# Patient Record
Sex: Male | Born: 1955 | Race: White | Hispanic: No | Marital: Married
Health system: Southern US, Academic
[De-identification: ages and names within clinical notes are randomized; demographics above are authoritative.]

## PROBLEM LIST (undated history)

## (undated) ENCOUNTER — Encounter

## (undated) ENCOUNTER — Ambulatory Visit: Attending: Family | Primary: Family

## (undated) ENCOUNTER — Telehealth

## (undated) ENCOUNTER — Ambulatory Visit: Payer: MEDICARE

## (undated) ENCOUNTER — Ambulatory Visit

## (undated) ENCOUNTER — Encounter: Attending: Family | Primary: Family

## (undated) ENCOUNTER — Ambulatory Visit: Payer: MEDICARE | Attending: Family | Primary: Family

## (undated) ENCOUNTER — Ambulatory Visit: Payer: Medicare (Managed Care)

## (undated) ENCOUNTER — Telehealth
Attending: Student in an Organized Health Care Education/Training Program | Primary: Student in an Organized Health Care Education/Training Program

## (undated) ENCOUNTER — Inpatient Hospital Stay

## (undated) ENCOUNTER — Telehealth
Attending: Rehabilitative and Restorative Service Providers" | Primary: Rehabilitative and Restorative Service Providers"

## (undated) ENCOUNTER — Ambulatory Visit: Payer: MEDICARE | Attending: Rheumatology | Primary: Rheumatology

## (undated) ENCOUNTER — Encounter
Attending: Rehabilitative and Restorative Service Providers" | Primary: Rehabilitative and Restorative Service Providers"

## (undated) DIAGNOSIS — T4145XA Adverse effect of unspecified anesthetic, initial encounter: Secondary | ICD-10-CM

## (undated) DIAGNOSIS — F419 Anxiety disorder, unspecified: Secondary | ICD-10-CM

## (undated) DIAGNOSIS — M1990 Unspecified osteoarthritis, unspecified site: Secondary | ICD-10-CM

## (undated) DIAGNOSIS — I10 Essential (primary) hypertension: Secondary | ICD-10-CM

## (undated) DIAGNOSIS — F32A Depression, unspecified: Secondary | ICD-10-CM

## (undated) DIAGNOSIS — M7061 Trochanteric bursitis, right hip: Secondary | ICD-10-CM

## (undated) DIAGNOSIS — E785 Hyperlipidemia, unspecified: Secondary | ICD-10-CM

## (undated) DIAGNOSIS — F329 Major depressive disorder, single episode, unspecified: Secondary | ICD-10-CM

## (undated) HISTORY — PX: TONSILLECTOMY: SUR1361

## (undated) MED ORDER — METFORMIN ER 500 MG TABLET,EXTENDED RELEASE 24 HR: 0 days

## (undated) MED ORDER — NITROGLYCERIN 0.4 MG SUBLINGUAL TABLET: 0 days

## (undated) MED ORDER — TIZANIDINE 4 MG TABLET: 0 days

## (undated) MED ORDER — LOSARTAN 25 MG TABLET: 0 days

## (undated) MED ORDER — DULOXETINE 30 MG CAPSULE,DELAYED RELEASE: 0 days

## (undated) MED ORDER — LISINOPRIL 20 MG-HYDROCHLOROTHIAZIDE 12.5 MG TABLET: 0 days

## (undated) MED ORDER — ASPIRIN 81 MG CHEWABLE TABLET: 0 days

## (undated) MED ORDER — EMPAGLIFLOZIN 25 MG TABLET: 0 days

## (undated) MED ORDER — AMLODIPINE 5 MG TABLET: 0 days

## (undated) MED ORDER — PRASUGREL 10 MG TABLET: 0 days

## (undated) MED ORDER — LISINOPRIL 40 MG TABLET: 0 days

## (undated) MED ORDER — TRADJENTA 5 MG TABLET: 0 days

## (undated) MED ORDER — AMLODIPINE 10 MG TABLET: 0 days

## (undated) MED ORDER — TRAMADOL 50 MG TABLET: 0 days

## (undated) MED ORDER — PRAVASTATIN 40 MG TABLET: 0 days

## (undated) MED ORDER — INSULIN GLARGINE (U-100) 100 UNIT/ML (3 ML) SUBCUTANEOUS PEN: 0 days

---

## 1999-08-17 ENCOUNTER — Ambulatory Visit (HOSPITAL_BASED_OUTPATIENT_CLINIC_OR_DEPARTMENT_OTHER): Admission: RE | Admit: 1999-08-17 | Discharge: 1999-08-17 | Payer: Self-pay | Admitting: Orthopedic Surgery

## 2002-04-09 HISTORY — PX: CARDIAC CATHETERIZATION: SHX172

## 2002-12-21 ENCOUNTER — Encounter: Payer: Self-pay | Admitting: Emergency Medicine

## 2002-12-21 ENCOUNTER — Inpatient Hospital Stay (HOSPITAL_COMMUNITY): Admission: EM | Admit: 2002-12-21 | Discharge: 2002-12-23 | Payer: Self-pay | Admitting: Emergency Medicine

## 2004-04-01 ENCOUNTER — Emergency Department: Payer: Self-pay | Admitting: Unknown Physician Specialty

## 2004-04-09 HISTORY — PX: SHOULDER ARTHROSCOPY: SHX128

## 2004-04-09 HISTORY — PX: KNEE ARTHROSCOPY: SUR90

## 2004-06-22 ENCOUNTER — Ambulatory Visit (HOSPITAL_COMMUNITY): Admission: RE | Admit: 2004-06-22 | Discharge: 2004-06-22 | Payer: Self-pay | Admitting: Orthopedic Surgery

## 2004-06-22 ENCOUNTER — Ambulatory Visit (HOSPITAL_BASED_OUTPATIENT_CLINIC_OR_DEPARTMENT_OTHER): Admission: RE | Admit: 2004-06-22 | Discharge: 2004-06-22 | Payer: Self-pay | Admitting: Orthopedic Surgery

## 2005-01-01 ENCOUNTER — Encounter: Admission: RE | Admit: 2005-01-01 | Discharge: 2005-01-01 | Payer: Self-pay | Admitting: Orthopedic Surgery

## 2005-02-14 ENCOUNTER — Ambulatory Visit (HOSPITAL_BASED_OUTPATIENT_CLINIC_OR_DEPARTMENT_OTHER): Admission: RE | Admit: 2005-02-14 | Discharge: 2005-02-14 | Payer: Self-pay | Admitting: Orthopedic Surgery

## 2005-02-14 ENCOUNTER — Ambulatory Visit (HOSPITAL_COMMUNITY): Admission: RE | Admit: 2005-02-14 | Discharge: 2005-02-14 | Payer: Self-pay | Admitting: Orthopedic Surgery

## 2007-04-10 DIAGNOSIS — T8859XA Other complications of anesthesia, initial encounter: Secondary | ICD-10-CM

## 2007-04-10 HISTORY — PX: SHOULDER ARTHROSCOPY: SHX128

## 2007-04-10 HISTORY — DX: Other complications of anesthesia, initial encounter: T88.59XA

## 2007-09-25 ENCOUNTER — Ambulatory Visit (HOSPITAL_BASED_OUTPATIENT_CLINIC_OR_DEPARTMENT_OTHER): Admission: RE | Admit: 2007-09-25 | Discharge: 2007-09-25 | Payer: Self-pay | Admitting: Orthopedic Surgery

## 2007-09-29 ENCOUNTER — Emergency Department: Payer: Self-pay | Admitting: Emergency Medicine

## 2008-12-23 ENCOUNTER — Ambulatory Visit: Payer: Self-pay | Admitting: Gastroenterology

## 2010-08-22 NOTE — Op Note (Signed)
NAME:  Lance [name], Lance [name]                 ACCOUNT NO.:  [id number]   MEDICAL RECORD NO.:  [id number]          PATIENT TYPE:  AMB   LOCATION:  DSC                          FACILITY:  [facility]   PHYSICIAN:  Loreta Ave, M.D. DATE OF BIRTH:  10/05/1955   DATE OF PROCEDURE:  DATE OF DISCHARGE:                               OPERATIVE REPORT   PREOPERATIVE DIAGNOSES:  Left shoulder impingement, distal clavicle  osteolysis.   POSTOPERATIVE DIAGNOSES:  Left shoulder impingement, distal clavicle  osteolysis, secondary adhesive capsulitis moderate.   PROCEDURES:  Left shoulder exam manipulation under anesthesia.  Arthroscopy with lysis debridement of adhesions and synovitis.  Bursectomy, acromioplasty CA ligament release.  Excision distal  clavicle.   SURGEON:  Loreta Ave, M.D.   ASSISTANT:  Zonia Kief, PA   ANESTHESIA:  General   BLOOD LOSS:  Minimal.   SPECIMEN:  None.   COUNTS:  None.   COMPLICATIONS:  None.   DRESSING:  Soft compressive with sling.   PROCEDURE:  The patient was brought to the operating room, placed on  operating table in supine position.  After adequate anesthesia had been  obtained the shoulder examined.  Lacked about 30% of motion, especially  overhead and external rotation.  Shoulder was manipulated with obvious  breaking of adhesions.  Achieve full motion stable shoulder.  Placed in  beachchair position on a shoulder positioner, prepped and draped in  usual sterile fashion.  Three portals created, once each anterior,  posterior, and lateral.  Shoulder entered with blunt obturator,  distended, inspected.  Remaining adhesions, synovitis all debrided.  Biceps tendon, biceps anchor, undersurface cuff, capsule ligamentous  structures, articular cartilage all looked good, otherwise.  Cannula  redirected subacromially.  A lot of reactive bursitis.  Type 2 to 3  acromion.  Bursa resected and cuff debrided.  No full-thickness tears.  Acromioplasty to a type 1  acromion with shaver and high-speed bur.  CA  ligament released with cautery.  Distal clavicle with grade 3 changes.  Periarticular spurs and lateral a centimeter of clavicle were resected  with shaver and high-speed bur.  A completion adequacy of decompression,  clavicle excision, acromioplasty confirmed viewing from all portals.  Instruments and fluid removed.  Portals shoulder bursa injected with  Marcaine.  Portals closed with nylon.  Sterile compressive dressing  applied.  Anesthesia reversed.  Brought to recovery room.  He tolerated  surgery well with no complications.      Loreta Ave, M.D.  Electronically Signed     DFM/MEDQ  D:  09/25/2007  T:  09/26/2007  Job:  [id number]

## 2010-08-25 NOTE — Op Note (Signed)
NAME:  Lance [name], Lance [name]                 ACCOUNT NO.:  [id number]   MEDICAL RECORD NO.:  [id number]          PATIENT TYPE:  AMB   LOCATION:  DSC                          FACILITY:  [facility]   PHYSICIAN:  Loreta Ave, M.D. DATE OF BIRTH:  10-Jan-1956   DATE OF PROCEDURE:  02/14/2005  DATE OF DISCHARGE:                                 OPERATIVE REPORT   PREOPERATIVE DIAGNOSIS:  Post traumatic impingement, partial tearing,  rotator cuff, right shoulder, degenerative joint disease acromioclavicular  joint, anterior labral tear.   POSTOPERATIVE DIAGNOSIS:  Post traumatic impingement, partial tearing,  rotator cuff, right shoulder, degenerative joint disease acromioclavicular  joint, anterior labral tear, without full thickness tears of the cuff,  partial tearing superior margin of the subscapular tendon and, also, some  partial tearing supraspinatus tendon above and below the anterior aspect.   OPERATIVE PROCEDURE:  Right shoulder exam under anesthesia, arthroscopy,  debridement of labrum, subscapular tendon, and supraspinatus tendon,  bursectomy, acromioplasty, coracoacromial ligament release, excision distal  clavicle.   SURGEON:  Loreta Ave, M.D.   ASSISTANT:  Genene Churn. Barry Dienes, P.A.-C.   ANESTHESIA:  General.   ESTIMATED BLOOD LOSS:  Minimal.   SPECIMENS:  None.   CULTURES:  None.   COMPLICATIONS:  None.   DRESSINGS:  Soft compressive with a sling.   PROCEDURE:  The patient was brought to the operating room and placed on the  operating table in supine position.  After adequate anesthesia had been  obtained, the right shoulder was examined.  Full motion and good stability.  Placed in a beach chair position on the shoulder positioner, prepped and  draped in the usual sterile fashion.  Anterior, posterior, and lateral  portals.  The shoulder entered with blunt obturator posteriorly.  Arthroscope introduced, shoulder distended.  Shoulder inspected.  A complex  tear anterior  labrum, debrided.  No significant perilabral cyst.  Partial  tearing top of the subscapular tendon also debrided but still very  reasonable integrity and no tearing off the lateral attachment.  Biceps  tendon, biceps anchor intact.  A little roughening under surface of the  rotator cuff anteriorly debrided.  No full thickness tears.  Capsular  ligamentous structures and remaining articular cartilage intact.  The frayed  tearing of the frontal ligament debrided.  Cannula redirected subacromially.  Impingement with some roughening on the top of the cuff debrided.  Bursa  resected.  Type 2-3 acromion.  Treated with acromioplasty with shaver and  high speed bur to a type 1 acromion.  CA ligament released with cautery.  Grade 3 and 4 chondromalacia with osteolysis, AC joint.  Periarticular spurs  with the lateral cm of clavicle resected.  Adequacy of decompression,  clavicle excision, and cuff debridement was confirmed viewing from all  portals.  Instruments and fluid removed.  Portals and bursa injected with  Marcaine.  Portals closed with 4-0 nylon.  Sterile compressive dressing  applied.  Sling applied.  Anesthesia reversed.  Brought to the recovery  room.  Tolerated the surgery well without complications.      Loreta Ave, M.D.  Electronically Signed     DFM/MEDQ  D:  02/14/2005  T:  02/14/2005  Job:  [id number]

## 2010-08-25 NOTE — Cardiovascular Report (Signed)
NAME:  DANNIEL, TONES NO.:  [id number]   MEDICAL RECORD NO.:  [id number]                   PATIENT TYPE:  INP   LOCATION:  3705                                 FACILITY:  [facility]   PHYSICIAN:  Vida Roller, M.D.                DATE OF BIRTH:  [birth year]   DATE OF PROCEDURE:  12/22/2002  DATE OF DISCHARGE:                              CARDIAC CATHETERIZATION   PRIMARY CARE PHYSICIAN:  Karrie Doffing, M.D., at the [facility].   CARDIOLOGIST:  Carole Binning, M.D.   HISTORY OF PRESENT ILLNESS:  Mr. Shi is a 55 year old, morbidly obese man  with diabetes mellitus, hypertension, and hyperlipidemia, who has  nonobstructive coronary artery disease on a catheterization done several  years ago at [place], [place], who presents with chest discomfort  concerning for coronary artery disease.  He was taken to the cardiac  catheterization laboratory.   DETAILS OF THE PROCEDURE:  After obtaining informed consent, the patient was  brought to the cardiac catheterization laboratory in the fasting state.  There he was prepped and draped in the usual sterile manner and local  anesthetic was obtained over the right groin using 1% lidocaine without  epinephrine.  The right femoral artery was cannulated using the modified  Seldinger technique with a 6 French 10 cm sheath.  Left heart  catheterization was performed using a 6 French Judkins left #4, a 6 French  right #4, and a 6 French angled pigtail catheter.  The pigtail catheter was  used for a left ventriculogram which was imaged in the 30 degree RAO view.  At the conclusion the procedure, the catheters were removed.  The patient  was moved back to the cardiology holding area where the femoral artery  sheath was removed.  Hemostasis was obtained using direct manual pressure.  At the conclusion of the hold, there was no evidence of ecchymosis or  hematoma formation.  Distal pulses were intact.   The total fluoroscopic time  was 2.8 minutes.  Total ionized contrast 140 mL.   RESULTS:  The aortic pressure was 144/94 with a mean arterial pressure of  117.   The left ventricular pressure was 146/9 with an end-diastolic pressure of 20  mmHg.   CORONARY ANGIOGRAPHY:  The left main coronary artery was a large vessel  which was angiographically normal.   The left anterior descending coronary artery was a large transapical vessel  with a 25% stenosis in its mid portion.  There was a large diagonal which  bifurcates along the anterolateral wall.  It is angiographically free of  disease.   The left circumflex coronary artery is a large caliber vessel which has  several small obtuse marginals in a posterolateral branch which branches.  There is no significant angiographic disease.   The right coronary is a large dominant vessel with two acute marginals, two  posterolateral branches, and  a moderate caliber posterior descending  coronary artery.  The right coronary artery is free of disease.   The left ventriculogram reveals an ejection fraction of 60% with no wall  motion abnormalities and no mitral regurgitation.   ASSESSMENT:  1. Nonobstructive coronary disease.  2. Normal left ventricular function.  3. Noncardiac chest pain.   PLAN:  Our plan is to discharge this patient to home in the morning to  modify aggressively his risk factors.  Karrie Doffing, M.D., and Carole Binning, M.D., are both aware of the results of this catheterization.                                               Vida Roller, M.D.    JH/MEDQ  D:  12/22/2002  T:  12/22/2002  Job:  [id number]

## 2010-08-25 NOTE — Discharge Summary (Signed)
NAME:  Lance [name], Lance [name] NO.:  [id number]   MEDICAL RECORD NO.:  [id number]                   PATIENT TYPE:  INP   LOCATION:  3705                                 FACILITY:  [facility]   PHYSICIAN:  Carole Binning, M.D. [facility]         DATE OF BIRTH:  06-24-1955   DATE OF ADMISSION:  12/21/2002  DATE OF DISCHARGE:  12/23/2002                           DISCHARGE SUMMARY - REFERRING   PROCEDURES:  1. Cardiac catheterization.  2. Coronary arteriogram.  3. Left ventriculogram.   HISTORY:  Lance [name] is a 55 year old male with no known history of coronary  artery disease.  He is followed by his family physician in [facility]  and was admitted four years ago to [facility] in  [place] with chest pain.  At that time, he had an abnormal stress  Cardiolite and cardiac catheterization, but there was no intervention done,  and the patient states the catheterization was clean.  Three days prior to  admission, he developed chest tightness and shortness of breath as well as  palpitations.  These resolved in seconds, but they were recurrent.  He came  to the emergency room and was admitted for further evaluation and treatment.   HOSPITAL COURSE:  His enzymes were negative for MI, but because of his risk  factors including diabetes, hypertension, and hyperlipidemia, he was  scheduled for cardiac catheterization which was performed on 12/22/2002.  The  cardiac catheterization showed no coronary artery disease except for 25%  lesion in the LAD.  His EF was 60% with no wall motion abnormalities and no  MR.  Dr. Dorethea Clan reviewed the films and felt that risk factor modification  was indicated.   As part of his evaluation, Lance [name] had a lipid profile performed which  showed a total cholesterol of 185, triglycerides 166, HDL 38, LDL 114.  He  had been on Lipitor 10 mg prior to admission, and this was increased to 20  mg.   Lance [name]'s blood  pressure dropped post catheterization, and his systolic  blood pressure was in the 90s at times.  HCTZ was held post catheterization,  and by discharge, his systolic blood pressure was in the 120s.  It was felt  this was secondary to the procedure, and he is to resume his normal blood  pressure control medications as an outpatient.   By 12/23/2002, Lance [name] was having no more episodes of chest pain.  He was  ambulating with no shortness of breath.  He was seen by the diabetes  coordinator, and arrangements were made to give him prescription for a new  glucose meter so that he would be more compliant with his diabetic diet and  follow his blood sugars more closely.  He was also advised that he needed to  stick more tightly to a diabetic diet and follow up with Dr. Illene [name].  Mr.  [name] was considered  stable for discharge on 12/23/2002.   LABORATORY DATA:  Sodium 139, potassium 3.9, chloride 103, CO2 28, BUN 10,  creatinine 1.1, glucose 154.  Hemoglobin 15.9, hematocrit 47.0, WBC 8.4,  platelets 232.   Chest x-ray: No acute cardiopulmonary findings.   CONDITION ON DISCHARGE:  Stable.   DISCHARGE DIAGNOSES:  1. Chest pain, no critical coronary artery disease by catheterization this     admission. Symptoms have resolved.  2. Preserved left ventricular function by catheterization this admission.  3. Hyperlipidemia.  4. Hypertension.  5. Hypotension this admission, resolved.  6. Diabetes.  7. Obesity.  8. History of multiple surgeries on both of his knees.  9. Anxiety/depression.   DISCHARGE INSTRUCTIONS:  1. His activity level is to include no driving, sexual or strenuous activity     for two days.  2. He is to call the office for problems with the catheterization site.  3. He is to stick to a low-fat diabetic diet.  4. He is to see Dr. Rinaldo Cloud and call for an appointment.  5. He is to follow up with Dr. Gerri Spore on October 4, at 2:15.   DISCHARGE MEDICATIONS:  1. Zoloft 50 mg  p.o. daily.  2. Aspirin 81 mg p.o. daily.  3. Glucotrol 10 mg p.o. daily.  4. Lipitor 20 mg p.o. daily.  5. HCTZ 25 mg p.o. daily, restart in a.m.  6. Metformin 1000 mg b.i.d., restart Friday, September 17.  7. Accupril 40 mg p.o. b.i.d.  8. Protonix 40 mg p.o. daily.      Lavella Hammock, P.A. [facility]                  Carole Binning, M.D. [facility]    RG/MEDQ  D:  12/23/2002  T:  12/23/2002  Job:  [id number]   cc:   Dr. [facility]  [place], [place]

## 2010-08-25 NOTE — Op Note (Signed)
[place]  Patient:    Lance [name], Lance [name]                        MRN: [id number] Proc. Date: 08/17/99 Adm. Date:  [id number] Disc. 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  OPERATION PERFORMED: 1. Subcutaneuos release, anterior and lateral compartments, right leg. 2. Subcutaneous release, anterior and lateral compartments, left leg.  SURGEON:  Loreta Ave, M.D.  ASSISTANT:  Arlys John D. Petrarca, P.A.-C.  ANESTHESIA:  General.  ESTIMATED BLOOD LOSS:  Minimal.  TOURNIQUET TIME:  30 minutes on each leg.  SPECIMENS:  None.  CULTURES:  None.  COMPLICATIONS:  None.  DRESSING:  Soft compressive.  DESCRIPTION OF PROCEDURE:  Patient was brought to the operating room and after adequate anesthesia had been obtained, tourniquet was applied about the upper aspect of both legs and both were prepped and draped in the usual sterile fashion.  Attention was then turned to the right leg first.  Exsanguinated with elevation and Esmarch.  Tourniquet inflated to 350 mmHg.  Longitudinal incision centered between the anterior and lateral compartments.  Skin and subcutaneous tissues divided.  Care taken to create a plane subcutaneously over the anterior and lateral compartment releases.  Both compartments identified and incised with care taken to protect neurovascular structures throughout.  Tissue was spread above and below the compartments both anterior lateral and they were released in their entirety from top to bottom avoiding neurovascular structures.  Able to visualize through the small incision the complete release of both  compartments.  Wound irrigated and closed with subcutaneous and subcuticular Vicryl.  Margins of the wound injected with Marcaine with epinephrine.  Temporary dressing applied.  Definitive sterile compressive dressing completed when the opposite leg was completed.  Attention was turned to the opposite left leg.  Exsanguinated with elevation and Esmarch.  Tourniquet inflated to 350 mmHg.  Straight incision centered between the anterior and lateral compartments in the left leg.  Skin and subcutaneous tissues divided.  Compartments identified and both the anterior and lateral compartments incised.  Long Metzenbaum scissors were used to clear tissue above and below the compartments both anterior and lateral, proximal and distal.  Both were released in their entirety under direct visualization. Care taken to avoid injury to neurovascular structures.  Entirely of release confirmed in both compartments.  Wound irrigated and closed with subcutaneous and subcuticular Vicryl.  Margins of the wound injected with Marcaine with epinephrine.  Sterile compressive dressing applied to both legs.  Tourniquet had been deflated and removed.  Anesthesia reversed.  Brought to recovery room.  Tolerated surgery well.  No complications. DD:  11/27/99 TD:  11/28/99 Job: [id number]

## 2010-08-25 NOTE — H&P (Signed)
NAME:  STEADMAN, PROSPERI NO.:  [id number]   MEDICAL RECORD NO.:  [id number]                   PATIENT TYPE:  EMS   LOCATION:  MAJO                                 FACILITY:  [facility]   PHYSICIAN:  Carole Binning, M.D. [facility]         DATE OF BIRTH:  [birth year]   DATE OF ADMISSION:  12/21/2002  DATE OF DISCHARGE:                                HISTORY & PHYSICAL   PRIMARY CARE PHYSICIAN:  Dr. Illene Regulus.   CHIEF COMPLAINT:  Chest pain.   HISTORY OF PRESENT ILLNESS:  Mr. Mendez is a 55 year old male with  cardiovascular risk factors of diabetes mellitus, hypertension, and  hyperlipidemia.  His cardiac history dates back to approximately four years  ago when he was admitted to [place], [place], with chest pain.  He apparently had an abnormal stress Cardiolite and subsequently underwent  cardiac catheterization.  By his report, this showed no significant coronary  artery disease.  Since then, he has done relatively well until the past  several days.  Three days ago in the evening, he developed symptoms of  palpitations which were associated with some tightness in his chest and  shortness of breath.  This lasted for only a few seconds and then resolved.  However, over the past two days, he has had recurrent episodes of chest  discomfort.  It is described as a pressure in his chest associated with  exertion.  It is usually relieved within a few minutes of rest.  His chest  pain has progressed to where he states that he experiences chest discomfort  with any type of physical activity.  This morning he had pain radiating into  his shoulder and left arm and thus he presented to the emergency room.  He  is currently pain-free.  In addition, he has noted some mild exertional  dyspnea which has been gradually progressive.  He denies any orthopnea, PND,  or edema.  He has not had any prolonged episodes of chest pain.   Cardiac risk factors are as outlined  above.  He denies a history of tobacco  use or a known family history of premature coronary artery disease.   The past medical history, medications, social history, family history, and  review of systems are as documented in the physician assistant admission  note.   PHYSICAL EXAMINATION:  GENERAL APPEARANCE:  This is a moderately overweight,  but otherwise well-appearing male in no acute distress.  VITAL SIGNS:  Temperature 98.1 degrees, pulse 78 and regular, blood pressure  131/71.  SKIN:  Warm and dry without generalized rash.  HEENT:  Normocephalic and atraumatic.  Sclerae anicteric.  Oral mucosa  unremarkable.  NECK:  No adenopathy or thyromegaly.  No JVD.  Carotid upstrokes normal  without bruit.  CHEST:  Clear to auscultation and percussion.  CARDIAC:  PMI nonpalpable.  Regular rate and rhythm.  Soft S4.  Normal S1  and  S2 without rub, murmur, or S3.  ABDOMEN:  Soft and nontender.  Normal bowel sounds.  No organomegaly.  No  bruits.  EXTREMITIES:  No cyanosis, clubbing, or edema.  Peripheral pulses 2+  throughout.   LABORATORY DATA:  The EKG reveals normal sinus rhythm at a rate of 77 and  normal EKG.   ASSESSMENT:  The patient is a 55 year old male with multiple cardiac risk  factors including diabetes, hyperlipidemia, and hypertension.  He presents  with progressive exertional chest pain relieved with rest.  Of note, he did  have normal coronary arteries on catheterization by his report approximately  four years ago after having an abnormal stress test.   PLAN:  The patient will be admitted to a telemetry bed.  We will obtain  serial cardiac enzymes to rule out myocardial infarction.  Because of the  recurrent nature of his chest pain and apparent history of unreliable stress  testing and multiple risk factors, we will proceed with cardiac  catheterization to rule out significant coronary artery disease.  In the  meantime, he will be managed with aspirin, heparin,  nitrates, and beta  blockers as tolerated.                                                Carole Binning, M.D. [facility]    MWP/MEDQ  D:  12/21/2002  T:  12/21/2002  Job:  [id number]   cc:   Dr. Romie Jumper

## 2010-08-25 NOTE — Op Note (Signed)
NAME:  Lance [name], Lance [name]                 ACCOUNT NO.:  [id number]   MEDICAL RECORD NO.:  [id number]          PATIENT TYPE:  AMB   LOCATION:  DSC                          FACILITY:  [facility]   PHYSICIAN:  Loreta Ave, M.D. DATE OF BIRTH:  1955-08-25   DATE OF PROCEDURE:  06/22/2004  DATE OF DISCHARGE:                                 OPERATIVE REPORT   PREOPERATIVE DIAGNOSIS:  Symptomatic loose body, post-traumatic left knee.   POSTOPERATIVE DIAGNOSES:  Symptomatic loose body, post-traumatic left knee  with a partially tethered osteochondral loose body off the tibial eminence  and associated grade III chondral injury, medial femoral condyle.  Also some  mild diffuse grade II changes all three compartments.   PROCEDURE:  Examination under anesthesia, arthroscopy, removal of loose body  and chondroplasty of the medial femoral condyle.   SURGEON:  Loreta Ave, M.D.   ASSISTANT:  Genene Churn. Denton Meek.   ANESTHESIA:  Knee block with sedation.   ESTIMATED BLOOD LOSS:  None.   CULTURES:  None.   COMPLICATIONS:  None.   DRESSING:  Sterile compressive.   TOURNIQUET:  Not employed.   PROCEDURE:  The patient was brought to the operating room and after adequate  anesthesia had been obtained, the left knee was examined.  Full motion and  good stability, good patellofemoral tracking. The tourniquet and leg holder  were applied.  The leg was prepped and draped.  Three portals were created,  one superolateral, one each medial and lateral parapatellar.  The inflow  catheter was introduced and the knee was standard arthroscopically induced  and inspected.  Good patellofemoral tracking, no tethering and just mild  diffuse grade II changes.  No new chondral injury.  A fair amount of  scarring in the fat pad that was resected.  Cruciate ligaments intact with a  little laxity of the anterior cruciate ligament which was bifid but still  intact and functional.  The lateral meniscus and lateral  compartment were  normal.  Medially, there was a partially tethered 6 mm loose body off the  tibial spine that had been injured with his pivoting with a juxtaposed  chondral flap grade III of the medial femoral condyle right at the margin  next to the spine.  The loose body was freed up and removed in two  fragments.  A chondroplasty to a stable surface on the femur.  The remaining  articular cartilage, medial and lateral compartments had some grade I and II  changes but nothing marked and reasonable remaining cartilage.  All recesses  were examined.  No other findings  appreciated.  The instruments were further removed. The portals of the knee  were injected with Marcaine.  The portals were closed with 4-0 nylon.  A  sterile compressive dressing was applied.  Anesthesia was reversed.  Brought  to the recovery room.  Tolerated his surgery well.  No complications.      DFM/MEDQ  D:  06/22/2004  T:  06/22/2004  Job:  [id number]

## 2011-01-04 LAB — POCT I-STAT, CHEM 8
BUN: 18
Calcium, Ion: 1.01 — ABNORMAL LOW
Chloride: 107
Creatinine, Ser: 0.7
Glucose, Bld: 153 — ABNORMAL HIGH
HCT: 51
Hemoglobin: 17.3 — ABNORMAL HIGH
Potassium: 5.1
Sodium: 134 — ABNORMAL LOW
TCO2: 21

## 2011-01-04 LAB — POCT HEMOGLOBIN-HEMACUE: Hemoglobin: 14.9

## 2011-03-05 ENCOUNTER — Encounter (HOSPITAL_COMMUNITY): Payer: Self-pay | Admitting: *Deleted

## 2011-03-05 NOTE — Progress Notes (Signed)
To come in for ekg and bmet- Dos-bring meds and crutches

## 2011-03-07 ENCOUNTER — Encounter (HOSPITAL_BASED_OUTPATIENT_CLINIC_OR_DEPARTMENT_OTHER)
Admission: RE | Admit: 2011-03-07 | Discharge: 2011-03-07 | Disposition: A | Discharge status: Home / Self Care | Payer: Medicare Other | Source: Ambulatory Visit | Attending: Orthopedic Surgery | Admitting: Orthopedic Surgery

## 2011-03-07 ENCOUNTER — Other Ambulatory Visit: Payer: Self-pay

## 2011-03-07 LAB — BASIC METABOLIC PANEL
BUN: 15 mg/dL (ref 6–23)
CO2: 25 mEq/L (ref 19–32)
Calcium: 9.7 mg/dL (ref 8.4–10.5)
Chloride: 99 mEq/L (ref 96–112)
Creatinine, Ser: 0.68 mg/dL (ref 0.50–1.35)
GFR calc Af Amer: >90 mL/min (ref >90)
GFR calc non Af Amer: >90 mL/min (ref >90)
Glucose, Bld: 231 mg/dL — ABNORMAL HIGH (ref 70–99)
Potassium: 4.3 mEq/L (ref 3.5–5.1)
Sodium: 135 mEq/L (ref 135–145)

## 2011-03-07 NOTE — H&P (Signed)
[facility] [address]   [address] [place], [place] [address] [phone] A Division of [facility]  Loreta Ave, M.D.     Robert A. Thurston Hole, M.D.     Lunette Stands, M.D. Eulas Post, M.D.    Buford Dresser, M.D. Estell Harpin, M.D. Ralene Cork, D.O.          Genene Churn. Barry Dienes, PA-C            Kirstin A. Shepperson, PA-C Janace Litten, OPA-C   RE: Mikhi, Athey   [phone]      DOB: August 04, 1955 PROGRESS NOTE: 01-25-11 Today I had a phone conversation with Mr. Mateus in regards to left knee MRI scan performed 01/20/11. Scan showed progressive medial compartment degenerative chondrosis with degeneration of the anterior horn of the medial meniscus adjacent cyst extending into Hoffa's fat pad reflecting ganglion with small parameniscal cyst. No significant patellofemoral lateral compartment findings. States knee continues to be symptomatic. Advised best treatment option would be outpatient arthroscopy debridement. He'll call to schedule. All questions answered.   Loreta Ave, M.D.  Electronically verified by Loreta Ave, M.D. DFM(JMO):kh D 01-29-11 T 01-29-11  [facility] [address]   [address] , [place] [address] [phone] A Division of [facility]  Loreta Ave, M.D.     Robert A. Thurston Hole, M.D.     Lunette Stands, M.D. Eulas Post, M.D.    Buford Dresser, M.D. Estell Harpin, M.D. Ralene Cork, D.O.          Genene Churn. Barry Dienes, PA-C            Kirstin A. Shepperson, PA-C [place], OPA-C   RE: Shepherd, Finnan                                [phone]      DOB: 1955/04/14 PROGRESS NOTE: 01-16-11 Roland comes in for evaluation of bilateral knee pain, left much greater than right.  No new injury.  He has developed episodes of popping, catching and locking, left knee.  This is getting more and more symptomatic.   He gets in a certain position with some flexion and feels like something gets caught in the middle of his knee.  He has to manipulate this to get it out of the way and then restore motion.  Getting worse rather than better.  Right knee has some soreness, but no mechanical symptoms.  He is status post bilateral Fulkerson procedures for patellofemoral issues in the 1990's.  Subsequent removal of the screws.  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  Status post arthroscopy of the left knee in 2006 for some advancing chondromalacia and loose bodies then.  He has had good results with all of those.  He comes in for this new issue.  At the time of arthroscopy in 2006 he did have some Grade III changes medial femoral condyle with loose body with diffuse Grade II changes throughout. Done quite well since that debridement until recently.   Remaining history is reviewed, updated and included in the chart.   EXAMINATION: General exam is outlined and included in the chart.  Specifically, both knees still have fairly good motion.  He does have a good correction of his Q angle with his Fulkerson  procedure.  He has 2+ crepitus on both sides, but this is really not that extreme patellofemoral.  Stable ligaments.  Full motion.  Good strength.  He has had nice releases of his compartments in his legs and he is neurovascularly intact.  Negative log roll both hips.  Negative straight leg raise, both sides.  On the left he is sore lateral joint line, but I really don't appreciate true locking, but if he has a loose body this is going to be difficult to see on exam at this time.    X-RAYS: Repeat x-rays, standing both knees, really show minimal degenerative changes in both knees, despite everything that has been done.    DISPOSITION:  This really sounds like he has a new loose body on the left that is going to come down to arthroscopic  debridement.  We discussed that procedure.  We filled out paperwork.  Risks, benefits and complications reviewed.  I would like to get an MRI ahead of time, as I am not seeing an obvious osseous loose body on x-ray.  I went over this with him and he understands.  He will call when the MRI is complete prior to operative intervention.  More than 25 minutes spent face-to-face covering all of this with him.    Loreta Ave, M.D.   Electronically verified by Loreta Ave, M.D. DFM:jjh D 01-18-11 T 01-18-11

## 2011-03-08 ENCOUNTER — Encounter (HOSPITAL_BASED_OUTPATIENT_CLINIC_OR_DEPARTMENT_OTHER)
Admission: RE | Disposition: A | Discharge status: Home / Self Care | Payer: Self-pay | Source: Ambulatory Visit | Attending: Orthopedic Surgery

## 2011-03-08 ENCOUNTER — Encounter (HOSPITAL_BASED_OUTPATIENT_CLINIC_OR_DEPARTMENT_OTHER): Payer: Self-pay | Admitting: Anesthesiology

## 2011-03-08 ENCOUNTER — Encounter (HOSPITAL_BASED_OUTPATIENT_CLINIC_OR_DEPARTMENT_OTHER): Payer: Self-pay | Admitting: *Deleted

## 2011-03-08 ENCOUNTER — Ambulatory Visit (HOSPITAL_BASED_OUTPATIENT_CLINIC_OR_DEPARTMENT_OTHER): Payer: Medicare Other | Admitting: Anesthesiology

## 2011-03-08 ENCOUNTER — Ambulatory Visit (HOSPITAL_BASED_OUTPATIENT_CLINIC_OR_DEPARTMENT_OTHER)
Admission: RE | Admit: 2011-03-08 | Discharge: 2011-03-08 | Disposition: A | Discharge status: Home / Self Care | Payer: Medicare Other | Source: Ambulatory Visit | Attending: Orthopedic Surgery | Admitting: Orthopedic Surgery

## 2011-03-08 DIAGNOSIS — Z01812 Encounter for preprocedural laboratory examination: Secondary | ICD-10-CM | POA: Insufficient documentation

## 2011-03-08 DIAGNOSIS — M23319 Other meniscus derangements, anterior horn of medial meniscus, unspecified knee: Secondary | ICD-10-CM | POA: Insufficient documentation

## 2011-03-08 DIAGNOSIS — Z01810 Encounter for preprocedural cardiovascular examination: Secondary | ICD-10-CM | POA: Insufficient documentation

## 2011-03-08 DIAGNOSIS — I10 Essential (primary) hypertension: Secondary | ICD-10-CM | POA: Insufficient documentation

## 2011-03-08 DIAGNOSIS — E669 Obesity, unspecified: Secondary | ICD-10-CM | POA: Insufficient documentation

## 2011-03-08 DIAGNOSIS — M2340 Loose body in knee, unspecified knee: Secondary | ICD-10-CM | POA: Insufficient documentation

## 2011-03-08 DIAGNOSIS — E119 Type 2 diabetes mellitus without complications: Secondary | ICD-10-CM | POA: Insufficient documentation

## 2011-03-08 DIAGNOSIS — Z4789 Encounter for other orthopedic aftercare: Secondary | ICD-10-CM

## 2011-03-08 DIAGNOSIS — M23359 Other meniscus derangements, posterior horn of lateral meniscus, unspecified knee: Secondary | ICD-10-CM | POA: Insufficient documentation

## 2011-03-08 HISTORY — PX: KNEE ARTHROSCOPY: SHX127

## 2011-03-08 HISTORY — DX: Unspecified osteoarthritis, unspecified site: M19.90

## 2011-03-08 HISTORY — DX: Adverse effect of unspecified anesthetic, initial encounter: T41.45XA

## 2011-03-08 HISTORY — DX: Hyperlipidemia, unspecified: E78.5

## 2011-03-08 HISTORY — DX: Essential (primary) hypertension: I10

## 2011-03-08 LAB — POCT HEMOGLOBIN-HEMACUE: Hemoglobin: 14.2 g/dL (ref 13.0–17.0)

## 2011-03-08 LAB — GLUCOSE, CAPILLARY
Glucose-Capillary: 180 mg/dL — ABNORMAL HIGH (ref 70–99)
Glucose-Capillary: 135 mg/dL — ABNORMAL HIGH (ref 70–99)

## 2011-03-08 SURGERY — ARTHROSCOPY, KNEE
Anesthesia: General | Site: Knee | Laterality: Left | Wound class: Clean

## 2011-03-08 MED ORDER — CEFAZOLIN SODIUM-DEXTROSE 2-3 GM-% IV SOLR
2.0000 g | INTRAVENOUS | Status: AC
  Administered 2011-03-08: 2 g via INTRAVENOUS

## 2011-03-08 MED ORDER — FENTANYL CITRATE 0.05 MG/ML IJ SOLN
INTRAMUSCULAR | Status: DC | PRN
  Administered 2011-03-08 (×3): 50 ug via INTRAVENOUS

## 2011-03-08 MED ORDER — ONDANSETRON HCL 4 MG/2ML IJ SOLN
INTRAMUSCULAR | Status: DC | PRN
  Administered 2011-03-08: 4 mg via INTRAVENOUS

## 2011-03-08 MED ORDER — LACTATED RINGERS IV SOLN
INTRAVENOUS | Status: DC
  Administered 2011-03-08: 11:00:00 via INTRAVENOUS

## 2011-03-08 MED ORDER — MORPHINE SULFATE 4 MG/ML IJ SOLN
INTRAMUSCULAR | Status: DC | PRN
  Administered 2011-03-08: 4 mg via INTRAVENOUS

## 2011-03-08 MED ORDER — MIDAZOLAM HCL 5 MG/5ML IJ SOLN
INTRAMUSCULAR | Status: DC | PRN
  Administered 2011-03-08: 2 mg via INTRAVENOUS

## 2011-03-08 MED ORDER — HYDROMORPHONE HCL PF 1 MG/ML IJ SOLN
0.2500 mg | INTRAMUSCULAR | Status: DC | PRN
  Administered 2011-03-08 (×2): 0.5 mg via INTRAVENOUS

## 2011-03-08 MED ORDER — LIDOCAINE HCL (CARDIAC) 20 MG/ML IV SOLN
INTRAVENOUS | Status: DC | PRN
  Administered 2011-03-08: 100 mg via INTRAVENOUS

## 2011-03-08 MED ORDER — CEFAZOLIN SODIUM 1-5 GM-% IV SOLN: 1.0000 g | INTRAVENOUS | Status: DC

## 2011-03-08 MED ORDER — PROMETHAZINE HCL 25 MG/ML IJ SOLN: 6.2500 mg | INTRAMUSCULAR | Status: DC | PRN

## 2011-03-08 MED ORDER — EPHEDRINE SULFATE 50 MG/ML IJ SOLN
INTRAMUSCULAR | Status: DC | PRN
Start: 2011-03-08 — End: 2011-03-08
  Administered 2011-03-08: 10 mg via INTRAVENOUS

## 2011-03-08 MED ORDER — SODIUM CHLORIDE 0.9 % IR SOLN
Status: DC | PRN
  Administered 2011-03-08: 3000 mL

## 2011-03-08 MED ORDER — CHLORHEXIDINE GLUCONATE 4 % EX LIQD
60.0000 mL | Freq: Once | CUTANEOUS | Status: DC
Start: 2011-03-08 — End: 2011-03-08

## 2011-03-08 MED ORDER — ACETAMINOPHEN 10 MG/ML IV SOLN
1000.0000 mg | Freq: Once | INTRAVENOUS | Status: AC
  Administered 2011-03-08: 1000 mg via INTRAVENOUS

## 2011-03-08 MED ORDER — BUPIVACAINE HCL (PF) 0.5 % IJ SOLN
INTRAMUSCULAR | Status: DC | PRN
  Administered 2011-03-08: 20 mL

## 2011-03-08 MED ORDER — OXYCODONE-ACETAMINOPHEN 5-325 MG PO TABS
1.0000 | ORAL_TABLET | Freq: Once | ORAL | Status: AC
  Administered 2011-03-08: 1 via ORAL

## 2011-03-08 MED ORDER — PROPOFOL 10 MG/ML IV EMUL
INTRAVENOUS | Status: DC | PRN
  Administered 2011-03-08: 200 mg via INTRAVENOUS

## 2011-03-08 SURGICAL SUPPLY — 37 items
BANDAGE ELASTIC 6 VELCRO ST LF (GAUZE/BANDAGES/DRESSINGS) ×2 IMPLANT
BLADE CUDA 5.5 (BLADE) IMPLANT
BLADE CUDA GRT WHITE 3.5 (BLADE) IMPLANT
BLADE CUTTER GATOR 3.5 (BLADE) ×2 IMPLANT
BLADE CUTTER MENIS 5.5 (BLADE) IMPLANT
BLADE GREAT WHITE 4.2 (BLADE) ×2 IMPLANT
BUR OVAL 4.0 (BURR) IMPLANT
CANISTER OMNI JUG 16 LITER (MISCELLANEOUS) ×2 IMPLANT
CANISTER SUCTION 2500CC (MISCELLANEOUS) IMPLANT
CLOTH BEACON ORANGE TIMEOUT ST (SAFETY) ×2 IMPLANT
CUTTER MENISCUS  4.2MM (BLADE)
CUTTER MENISCUS 4.2MM (BLADE) IMPLANT
DRAPE ARTHROSCOPY W/POUCH 90 (DRAPES) ×2 IMPLANT
DURAPREP 26ML APPLICATOR (WOUND CARE) ×2 IMPLANT
ELECT MENISCUS 165MM 90D (ELECTRODE) IMPLANT
ELECT REM PT RETURN 9FT ADLT (ELECTROSURGICAL) ×2
ELECTRODE REM PT RTRN 9FT ADLT (ELECTROSURGICAL) IMPLANT
GAUZE XEROFORM 1X8 LF (GAUZE/BANDAGES/DRESSINGS) ×2 IMPLANT
GLOVE BIO SURGEON STRL SZ 6.5 (GLOVE) ×1 IMPLANT
GLOVE BIOGEL PI IND STRL 8 (GLOVE) ×1 IMPLANT
GLOVE BIOGEL PI INDICATOR 8 (GLOVE) ×1
GLOVE INDICATOR 7.0 STRL GRN (GLOVE) ×1 IMPLANT
GLOVE ORTHO TXT STRL SZ7.5 (GLOVE) ×4 IMPLANT
GOWN BRE IMP PREV XXLGXLNG (GOWN DISPOSABLE) ×2 IMPLANT
GOWN PREVENTION PLUS XLARGE (GOWN DISPOSABLE) ×2 IMPLANT
HOLDER KNEE FOAM BLUE (MISCELLANEOUS) ×2 IMPLANT
KNEE WRAP E Z 3 GEL PACK (MISCELLANEOUS) ×1 IMPLANT
PACK ARTHROSCOPY DSU (CUSTOM PROCEDURE TRAY) ×2 IMPLANT
PACK BASIN DAY SURGERY FS (CUSTOM PROCEDURE TRAY) ×2 IMPLANT
PENCIL BUTTON HOLSTER BLD 10FT (ELECTRODE) IMPLANT
SET ARTHROSCOPY TUBING (MISCELLANEOUS) ×2
SET ARTHROSCOPY TUBING LN (MISCELLANEOUS) ×1 IMPLANT
SPONGE GAUZE 4X4 12PLY (GAUZE/BANDAGES/DRESSINGS) ×4 IMPLANT
SUT ETHILON 3 0 PS 1 (SUTURE) ×2 IMPLANT
SUT VIC AB 3-0 FS2 27 (SUTURE) IMPLANT
TOWEL OR 17X24 6PK STRL BLUE (TOWEL DISPOSABLE) ×2 IMPLANT
WATER STERILE IRR 1000ML POUR (IV SOLUTION) ×2 IMPLANT

## 2011-03-08 NOTE — Transfer of Care (Signed)
Immediate Anesthesia Transfer of Care Note  Patient: Lance [name]  Procedure(s) Performed:  ARTHROSCOPY KNEE - left knee arthroscopy  anterior horn  medial meniscus cyst  Patient Location: PACU  Anesthesia Type: General  Level of Consciousness: sedated  Airway & Oxygen Therapy: Patient Spontanous Breathing and Patient connected to face mask oxygen  Post-op Assessment: Report given to PACU RN and Post -op Vital signs reviewed and stable  Post vital signs: Reviewed and stable  Complications: No apparent anesthesia complications

## 2011-03-08 NOTE — Anesthesia Preprocedure Evaluation (Signed)
Anesthesia Evaluation  Patient identified by MRN, date of birth, ID band Patient awake    Reviewed: Allergy & Precautions, H&P , NPO status , Patient's Chart, lab work & pertinent test results  Airway Mallampati: II TM Distance: >3 FB Neck ROM: Full    Dental   Pulmonary    Pulmonary exam normal       Cardiovascular hypertension,     Neuro/Psych    GI/Hepatic   Endo/Other  Diabetes mellitus-  Renal/GU      Musculoskeletal   Abdominal (+) obese,   Peds  Hematology   Anesthesia Other Findings   Reproductive/Obstetrics                           Anesthesia Physical Anesthesia Plan  ASA: III  Anesthesia Plan: General   Post-op Pain Management:    Induction: Intravenous  Airway Management Planned: LMA  Additional Equipment:   Intra-op Plan:   Post-operative Plan: Extubation in OR  Informed Consent: I have reviewed the patients History and Physical, chart, labs and discussed the procedure including the risks, benefits and alternatives for the proposed anesthesia with the patient or authorized representative who has indicated his/her understanding and acceptance.     Plan Discussed with: CRNA and Surgeon  Anesthesia Plan Comments:         Anesthesia Quick Evaluation

## 2011-03-08 NOTE — Anesthesia Procedure Notes (Signed)
Procedure Name: LMA Insertion Performed by: Sharyne Richters Pre-anesthesia Checklist: Patient identified, Emergency Drugs available, Suction available, Patient being monitored and Timeout performed Patient Re-evaluated:Patient Re-evaluated prior to inductionOxygen Delivery Method: Circle System Utilized Preoxygenation: Pre-oxygenation with 100% oxygen Intubation Type: IV induction Ventilation: Mask ventilation without difficulty LMA: LMA with gastric port inserted LMA Size: 4.0 Number of attempts: 1 Placement Confirmation: breath sounds checked- equal and bilateral and positive ETCO2 Tube secured with: Tape Dental Injury: Teeth and Oropharynx as per pre-operative assessment

## 2011-03-08 NOTE — Interval H&P Note (Signed)
History and Physical Interval Note:  03/08/2011 7:27 AM  Lance [name]  has presented today for surgery, with the diagnosis of left knee loose body  The various methods of treatment have been discussed with the patient and family. After consideration of risks, benefits and other options for treatment, the patient has consented to  Procedure(s): ARTHROSCOPY KNEE as a surgical intervention .  The patients' history has been reviewed, patient examined, no change in status, stable for surgery.  I have reviewed the patients' chart and labs.  Questions were answered to the patient's satisfaction.     Lance [name]

## 2011-03-08 NOTE — Anesthesia Postprocedure Evaluation (Signed)
  Anesthesia Post-op Note  Patient: Lance [name]  Procedure(s) Performed:  ARTHROSCOPY KNEE - left knee arthroscopy  anterior horn  medial meniscus cyst  Patient Location: PACU  Anesthesia Type: General  Level of Consciousness: awake, alert  and oriented  Airway and Oxygen Therapy: Patient Spontanous Breathing  Post-op Pain: mild  Post-op Assessment: Post-op Vital signs reviewed, Patient's Cardiovascular Status Stable, Respiratory Function Stable, Patent Airway, No signs of Nausea or vomiting and Pain level controlled  Post-op Vital Signs: stable  Complications: No apparent anesthesia complications

## 2011-03-08 NOTE — Brief Op Note (Signed)
03/08/2011  12:46 PM  PATIENT:  Lance [name]  55 y.o. male  PRE-OPERATIVE DIAGNOSIS:  left knee parameniscal cyst   POST-OPERATIVE DIAGNOSIS:  left knee parameniscal cyst, lat meniscus tear  PROCEDURE:  Procedure(s): Left knee scope with debridement medial and lat meniscus  SURGEON:  Surgeon(s): Loreta Ave, MD  PHYSICIAN ASSISTANT: Zonia Kief M   ANESTHESIA:   general  EBL:  Total I/O In: 1300 [I.V.:1300] Out: -    SPECIMEN:  No Specimen  DISPOSITION OF SPECIMEN:  N/A   TOURNIQUET:  * No tourniquets in log *   PATIENT DISPOSITION:  PACU - hemodynamically stable.

## 2011-03-09 ENCOUNTER — Encounter (HOSPITAL_BASED_OUTPATIENT_CLINIC_OR_DEPARTMENT_OTHER): Payer: Self-pay | Admitting: Orthopedic Surgery

## 2011-03-09 NOTE — Op Note (Signed)
NAME:  [name], Lance                      ACCOUNT NO.:  MEDICAL RECORD NO.:  [id number]  LOCATION:                                 FACILITY:  PHYSICIAN:  Loreta Ave, M.D. DATE OF BIRTH:  [birth year]  DATE OF PROCEDURE:  03/08/2011 DATE OF DISCHARGE:                              OPERATIVE REPORT   PREOPERATIVE DIAGNOSES:  Left knee chondromalacia patella, previous patellar realignment.  Question loose body.  Anterior horn medial meniscus peripheral tear and cyst.  POSTOPERATIVE DIAGNOSES:  Left knee anterior horn medial meniscal small cyst peripherally.  Flap tear of posterolateral meniscus.  Grade 2 changes to lateral tibial plateau.  Some small chondral loose bodies but no other large loose body.  PROCEDURES:  Left knee exam under anesthesia, arthroscopy, partial medial and lateral meniscectomy, debridement of meniscal cyst as well. Chondroplasty of the lateral tibial plateau.  Also lysis and debridement of adhesions in the anterior compartment.  SURGEON:  Loreta Ave, MD  ASSISTANT:  Genene Churn. Barry Dienes, [place]  ANESTHESIA:  General.  BLOOD LOSS:  Minimal.  SPECIMENS:  None.  CULTURES:  None.  COMPLICATION:  None.  DRESSINGS:  Soft, compressive.  TOURNIQUET:  Not employed.  PROCEDURE:  The patient was brought to the operating room, placed on the operating table in supine position.  After adequate anesthesia had been obtained, knee was examined.  Good patellar tracking and stability. Ligament stable.  Leg holder applied.  Leg was prepped and draped in usual sterile fashion.  Two portals, 1 each medial and lateral, parapatellar.  Arthroscope was introduced and knee was distended and inspected.  Excellent patellofemoral tracking.  A fair amount of adhesions in the front of the knee, all cleared out.  A little to no chondral changes in the patellofemoral joint.  Diffuse small chondral loose bodies on the lateral tibial plateau were debrided.  No large loose bodies  were seen.  Medial meniscus was intact throughout.  There was some small peripheral meniscal cyst of the anterior horn that was debrided out tapering it smoothly.  I did not really need to remove any more of meniscus.  Cruciate ligaments looked good.  Flap tear of the posterior horn of the lateral meniscus was saucerized out with baskets tapering it smoothly with a shaver.  I then thoroughly inspected the entire knee looking in all recesses as well as the posterior compartment.  No other loose bodies were seen. Instruments and fluid were removed.  Knee was injected with Depo-Medrol and Marcaine.  Portals were closed with nylon.  Sterile compressive dressing was applied.  Anesthesia reversed.  Brought to the recovery room.  Tolerated surgery well.  No complications.     Loreta Ave, M.D.     DFM/MEDQ  D:  03/08/2011  T:  03/08/2011  Job:  [id number]

## 2011-11-30 ENCOUNTER — Emergency Department: Payer: Self-pay | Admitting: Emergency Medicine

## 2011-11-30 LAB — URINALYSIS, COMPLETE
Bacteria: NONE SEEN
Bilirubin,UR: NEGATIVE
Blood: NEGATIVE
Glucose,UR: NEGATIVE mg/dL
Leukocyte Esterase: NEGATIVE
Nitrite: NEGATIVE
Ph: 5
Protein: NEGATIVE
RBC,UR: 1 /HPF
Specific Gravity: 1.021
Squamous Epithelial: <1
WBC UR: <1 /HPF

## 2011-11-30 LAB — COMPREHENSIVE METABOLIC PANEL WITH GFR
Albumin: 4.6 g/dL
Alkaline Phosphatase: 82 U/L
Anion Gap: 12
BUN: 14 mg/dL
Bilirubin,Total: 1.1 mg/dL — ABNORMAL HIGH
Calcium, Total: 9.2 mg/dL
Chloride: 99 mmol/L
Co2: 25 mmol/L
Creatinine: 1 mg/dL
EGFR (African American): >60
EGFR (Non-African Amer.): >60
Glucose: 224 mg/dL — ABNORMAL HIGH
Osmolality: 279
Potassium: 4.1 mmol/L
SGOT(AST): 25 U/L
SGPT (ALT): 32 U/L
Sodium: 136 mmol/L
Total Protein: 7.8 g/dL

## 2011-11-30 LAB — CBC
HCT: 45.4 %
HGB: 15.7 g/dL
MCH: 29.9 pg
MCHC: 34.5 g/dL
MCV: 87 fL
Platelet: 211 x10 3/mm 3
RBC: 5.24 x10 6/mm 3
RDW: 13.2 %
WBC: 17.5 x10 3/mm 3 — ABNORMAL HIGH

## 2012-06-09 ENCOUNTER — Observation Stay: Payer: Self-pay | Admitting: Specialist

## 2012-06-09 LAB — LIPID PANEL
Cholesterol: 155 mg/dL (ref 0–200)
HDL Cholesterol: 34 mg/dL — ABNORMAL LOW (ref 40–60)
Ldl Cholesterol, Calc: 95 mg/dL (ref 0–100)
Triglycerides: 128 mg/dL (ref 0–200)
VLDL Cholesterol, Calc: 26 mg/dL (ref 5–40)

## 2012-06-09 LAB — BASIC METABOLIC PANEL
Anion Gap: 3 — ABNORMAL LOW (ref 7–16)
BUN: 11 mg/dL (ref 7–18)
Calcium, Total: 8.7 mg/dL (ref 8.5–10.1)
Chloride: 105 mmol/L (ref 98–107)
Co2: 30 mmol/L (ref 21–32)
Creatinine: 0.83 mg/dL (ref 0.60–1.30)
EGFR (African American): >60
EGFR (Non-African Amer.): >60
Glucose: 161 mg/dL — ABNORMAL HIGH (ref 65–99)
Osmolality: 279 (ref 275–301)
Potassium: 4.1 mmol/L (ref 3.5–5.1)
Sodium: 138 mmol/L (ref 136–145)

## 2012-06-09 LAB — HEMOGLOBIN A1C: Hemoglobin A1C: 9.1 % — ABNORMAL HIGH (ref 4.2–6.3)

## 2012-06-09 LAB — CBC
HCT: 45.4 % (ref 40.0–52.0)
HGB: 15.7 g/dL (ref 13.0–18.0)
MCH: 29.5 pg (ref 26.0–34.0)
MCHC: 34.7 g/dL (ref 32.0–36.0)
MCV: 85 fL (ref 80–100)
Platelet: 197 10*3/uL (ref 150–440)
RBC: 5.34 10*6/uL (ref 4.40–5.90)
RDW: 13.3 % (ref 11.5–14.5)
WBC: 7.7 10*3/uL (ref 3.8–10.6)

## 2012-06-09 LAB — TROPONIN I
Troponin-I: <0.02 ng/mL
Troponin-I: <0.02 ng/mL

## 2012-06-09 LAB — CK TOTAL AND CKMB (NOT AT ARMC)
CK, Total: 113 U/L (ref 35–232)
CK-MB: 1.1 ng/mL (ref 0.5–3.6)

## 2012-06-10 DIAGNOSIS — R079 Chest pain, unspecified: Secondary | ICD-10-CM

## 2012-06-10 LAB — CBC WITH DIFFERENTIAL/PLATELET
Basophil #: 0.1 10*3/uL (ref 0.0–0.1)
Basophil %: 0.9 %
Eosinophil #: 0.3 10*3/uL (ref 0.0–0.7)
Eosinophil %: 3.5 %
HCT: 43.3 % (ref 40.0–52.0)
HGB: 15.1 g/dL (ref 13.0–18.0)
Lymphocyte #: 2 10*3/uL (ref 1.0–3.6)
Lymphocyte %: 27.2 %
MCH: 29.6 pg (ref 26.0–34.0)
MCHC: 34.9 g/dL (ref 32.0–36.0)
MCV: 85 fL (ref 80–100)
Monocyte #: 0.7 x10 3/mm (ref 0.2–1.0)
Monocyte %: 9 %
Neutrophil #: 4.4 10*3/uL (ref 1.4–6.5)
Neutrophil %: 59.4 %
Platelet: 179 10*3/uL (ref 150–440)
RBC: 5.1 10*6/uL (ref 4.40–5.90)
RDW: 13.3 % (ref 11.5–14.5)
WBC: 7.4 10*3/uL (ref 3.8–10.6)

## 2012-06-10 LAB — BASIC METABOLIC PANEL
Anion Gap: 1 — ABNORMAL LOW (ref 7–16)
BUN: 12 mg/dL (ref 7–18)
Calcium, Total: 8.5 mg/dL (ref 8.5–10.1)
Chloride: 106 mmol/L (ref 98–107)
Co2: 31 mmol/L (ref 21–32)
Creatinine: 0.85 mg/dL (ref 0.60–1.30)
EGFR (African American): >60
EGFR (Non-African Amer.): >60
Glucose: 182 mg/dL — ABNORMAL HIGH (ref 65–99)
Osmolality: 280 (ref 275–301)
Potassium: 4.2 mmol/L (ref 3.5–5.1)
Sodium: 138 mmol/L (ref 136–145)

## 2012-06-10 LAB — TROPONIN I: Troponin-I: <0.02 ng/mL

## 2013-05-11 ENCOUNTER — Ambulatory Visit: Payer: Self-pay | Admitting: Gastroenterology

## 2013-05-14 ENCOUNTER — Emergency Department: Payer: Self-pay | Admitting: Emergency Medicine

## 2013-05-14 LAB — CBC
HCT: 42 % (ref 40.0–52.0)
HGB: 14.6 g/dL (ref 13.0–18.0)
MCH: 29.9 pg (ref 26.0–34.0)
MCHC: 34.7 g/dL (ref 32.0–36.0)
MCV: 86 fL (ref 80–100)
Platelet: 167 10*3/uL (ref 150–440)
RBC: 4.87 10*6/uL (ref 4.40–5.90)
RDW: 13.5 % (ref 11.5–14.5)
WBC: 7.2 10*3/uL (ref 3.8–10.6)

## 2013-05-14 LAB — COMPREHENSIVE METABOLIC PANEL
Albumin: 3.8 g/dL (ref 3.4–5.0)
Alkaline Phosphatase: 64 U/L
Anion Gap: 3 — ABNORMAL LOW (ref 7–16)
BUN: 16 mg/dL (ref 7–18)
Bilirubin,Total: 0.9 mg/dL (ref 0.2–1.0)
Calcium, Total: 8.8 mg/dL (ref 8.5–10.1)
Chloride: 100 mmol/L (ref 98–107)
Co2: 28 mmol/L (ref 21–32)
Creatinine: 0.74 mg/dL (ref 0.60–1.30)
EGFR (African American): >60
EGFR (Non-African Amer.): >60
Glucose: 266 mg/dL — ABNORMAL HIGH (ref 65–99)
Osmolality: 273 (ref 275–301)
Potassium: 4.2 mmol/L (ref 3.5–5.1)
SGOT(AST): 41 U/L — ABNORMAL HIGH (ref 15–37)
SGPT (ALT): 30 U/L (ref 12–78)
Sodium: 131 mmol/L — ABNORMAL LOW (ref 136–145)
Total Protein: 7 g/dL (ref 6.4–8.2)

## 2013-05-14 LAB — URINALYSIS, COMPLETE
Bacteria: NONE SEEN
Bilirubin,UR: NEGATIVE
Blood: NEGATIVE
Glucose,UR: >=500 mg/dL (ref 0–75)
Ketone: NEGATIVE
Leukocyte Esterase: NEGATIVE
Nitrite: NEGATIVE
Ph: 6 (ref 4.5–8.0)
Protein: NEGATIVE
RBC,UR: NONE SEEN /HPF (ref 0–5)
Specific Gravity: 1.003 (ref 1.003–1.030)
Squamous Epithelial: NONE SEEN
WBC UR: <1 /HPF (ref 0–5)

## 2013-05-14 LAB — CK TOTAL AND CKMB (NOT AT ARMC)
CK, Total: 416 U/L — ABNORMAL HIGH
CK-MB: 2.2 ng/mL (ref 0.5–3.6)

## 2013-05-14 LAB — TROPONIN I: Troponin-I: <0.02 ng/mL

## 2013-05-19 ENCOUNTER — Ambulatory Visit: Payer: Self-pay | Admitting: Gastroenterology

## 2013-05-20 LAB — PATHOLOGY REPORT

## 2014-07-30 NOTE — H&P (Signed)
PATIENT NAME:  Lance [name], Lance [name] MR#:  [id number] DATE OF BIRTH:  07-30-1955  DATE OF ADMISSION:  06/09/2012  REFERRING PHYSICIAN:  Lurena Joiner L. Shaune Pollack, MD  PRIMARY CARE PHYSICIAN:  Leanna Sato, MD  CHIEF COMPLAINT:  Chest pain with exertion.   HISTORY OF PRESENT ILLNESS:  The patient is a very nice 59 year old gentleman who has history of osteoarthritis, hypertension, diabetes, hyperlipidemia who has developed chest pain that has been going on and off for the past month usually associated with significant shortness of breath whenever he is trying to go upstairs or walking around at his job. This morning around 8:30, he was working. He was getting around going down stairs, walking a long distance on a flat surface, then jogging a little bit and started to get short of breath. Whenever he stopped and started going upstairs, he had significant sudden onset of chest pain located on the right side. He also was really short of breath and he needed to stop after several steps. The patient states that the pain lasted for 45 minutes and only got better after he stopped doing activities and sat down. The patient states that the pain went away completely after 45 minutes by resting and lowered intensity significantly. The pain was located in the middle to right chest with radiation to the right shoulder and right arm. The patient has history of osteoarthritis and previous surgery on the right shoulder and he states that the pain has never happened on the left side, but it happens frequently on the right side. The patient has significant shortness of breath that happens very occasionally whenever he is going up stairs or walking more than a block. The patient says that in the past, 10 years ago he was evaluated by a cardiologist and he was told that he had a blockage. It is hard to tell if he had a stress test or a heart catheterization. He is not quite certain which one it was. The patient comes for evaluation of his  chest pain and is admitted. His cardiac workup so far is negative, but he has significant risk factors. Outside it is snowing and he lives more than 50 miles away from here for which we are going to leave to inpatient workup.   REVIEW OF SYSTEMS:   CONSTITUTIONAL: No fever. No fatigue. No weight loss or weight gain.  EYES: No blurry vision, double vision or changes of the eyesight.  ENT: No dysphagia. No difficulty swallowing. No tinnitus.  RESPIRATORY: No cough. No wheezing. Positive shortness of breath with exertion. No painful respirations. No COPD.  CARDIOVASCULAR: No orthopnea, no edema, no palpitations, no syncope. Positive right-sided chest pain as mentioned above.  GASTROINTESTINAL: No nausea, vomiting, diarrhea. No melena or hematochezia.  GENITOURINARY: No dysuria, hematuria or changes in urinary frequency. No prostatitis.  ENDOCRINE: No polyuria, polydipsia or polyphagia. No cold or heat intolerance. His blood sugar has been well controlled.  HEMATOLOGIC/LYMPHATIC: No swollen glands. No easy bruising or bleeding.  SKIN: Without any rashes or lesions.  MUSCULOSKELETAL: No significant neck pain or back pain. Positive right shoulder pain occasionally with activity.  NEUROLOGIC: No numbness, tingling. No CVAs. No TIAs at this moment, but the patient states that his fingers and toes occasionally get numb when he exercises or moves around. PSYCHIATRIC:  No depression or anxiety.   PAST MEDICAL HISTORY:   1.  Osteoarthritis.  2.  Hypertension.  3.  Diabetes.  4.  Hyperlipidemia recently diagnosed, not on treatment yet.  ALLERGIES:  No known drug allergies.   PAST SURGICAL HISTORY:   1.  Shoulder rotator cuff repair on the right side.  2.  Knee surgery x 15, arthroscopic surgeries, due to lesions on both knees after a fall.  3.  Tonsillectomy.   FAMILY HISTORY:  Positive for MI in his dad who had several MIs, the last one was at the age of 64. It was massive and killed him. A CVA in  his mother, diabetes in his grandfather and brother, no history of cancer.   SOCIAL HISTORY:  Does not smoke, does not drink and does not use drugs. He is on disability for his osteoarthritis, but he is working part time as a Electrical engineer. He lives with his wife.   MEDICATIONS:  Amlodipine 5 mg once daily, glipizide 5 mg 2 times a day, hydrochlorothiazide/lisinopril 12.5/20 mg once daily, metformin 1000 mg once daily.   PHYSICAL EXAMINATION: VITAL SIGNS: Blood pressure 140/86, pulse 85, respiratory rate 18. His pulse was 50 on the monitor whenever I checked on him. Temperature is 97.7 and oxygen saturation 95% on room air.  GENERAL: The patient is alert, oriented x 3. No acute distress. No respiratory distress. Hemodynamically stable. Pupils are equal and reactive. Extraocular movements are intact. Mucosa moist. Anicteric sclerae. Pink conjunctivae. No oral lesions.  NECK: Supple. No JVD. No thyromegaly. No adenopathy. No carotid bruits. No rigidity.  CARDIOVASCULAR: Regular rate and rhythm. No murmurs, rubs or gallops. No displacement of PMI. Positive reproduction of some degree of the pain at the level of the right middle chest, but the patient states that the pain is totally different than it was earlier.  LUNGS: Clear without any wheezing or crepitus. No use of accessory muscles.  ABDOMEN: Soft, nontender, nondistended. No hepatosplenomegaly. No masses. Bowel sounds are positive.  GENITAL: Deferred.  EXTREMITIES: No edema, no cyanosis, no clubbing.  NEUROLOGIC: Cranial nerves II through XII are intact. No focal abnormalities.  PSYCHIATRIC: Mood is normal without any signs of depression or anxiety.  LYMPHATICS: Negative for lymphadenopathy in the neck or supraclavicular areas.  MUSCULOSKELETAL: No significant joint deformity or effusions.   DIAGNOSTIC DATA:  CT of the chest with contrast shows no evidence of pulmonary embolism or aortic disease. Chest x-ray is within normal limits.  Chemistry: Glucose 161, creatinine 0.83, sodium 138, other electrolytes within normal limits. Total CK is 113, troponin 0.02. White count is 7.7, hemoglobin 15.7, platelets 197. EKG: Normal sinus rhythm, no ST depression or elevation.   ASSESSMENT AND PLAN:  A 59 year old gentleman with history of diabetes, hypertension, hyperlipidemia who comes today with chest pain on exertion which is atypical.  1.  Atypical chest pain. The patient may have atypical chest pain which is slightly reproducible, but he has risk factors of basically a family history of diabetes and hypertension. He has had tests before and it was thought that he had a blockage. He could not tell me if it was an angiogram or a stress test, but at this moment, I think that with his risk factors and pain related to exertion and especially because of the significant shortness of breath that happened with the pain that he has merits to obtain a Myoview scan. We are going to order a Myoview scan for the morning and serial troponins. We will follow up with a heart catheterization if any doubts are coming from the Myoview. At this moment, I am not going to add on a beta-blocker since the patient had a heart rate  of 50 which I am not quite sure if it was an artifact or a real low heart rate, but his heart rate is in the 90s right now. He does not have any chest pain right now. He does not have any changes on his st segment or hemodynamically stability.. I am going to check lipids and add on a statin if necessary. For now, I am just going to treat him with aspirin. He is not having any active chest pain, but I am going to put him on nitrates p.r.n. If the troponin comes up positive or the chest pain persists, we are going to do Lovenox 1 mg/kg, but at this moment, I do not think it is necessary. He is going to be admitted for rule out chest pain.  2.  Diabetes. Continue treatment with metformin and glipizide. Check hemoglobin A1c and a lipid profile.  3.   Hypertension. At this moment, it seems to be well controlled. Continue home regimen.  4.  Gastrointestinal prophylaxis with proton pump inhibitor.  5.  Deep vein thrombosis prophylaxis with heparin.  6.  The patient is a full code.   TIME SPENT:  I spent about 45 minutes with this patient today.    ____________________________ Felipa Furnaceoberto Sanchez Gutierrez, MD rsg:si D: 06/09/2012 20:30:34 ET T: 06/09/2012 23:30:53 ET JOB#: [id number]  cc: Felipa Furnaceoberto Sanchez Gutierrez, MD, <Dictator> Unity Luepke Juanda ChanceSANCHEZ GUTIERRE MD ELECTRONICALLY SIGNED 06/24/2012 12:57

## 2014-07-30 NOTE — Discharge Summary (Signed)
PATIENT NAME:  Lance [name], Lance [name] MR#:  [id number] DATE OF BIRTH:  November 02, 1955  DATE OF ADMISSION:  06/09/2012 DATE OF DISCHARGE:  06/10/2012  For a detailed note, please take a look at the history and physical done on admission by Dr. Mordecai MaesSanchez.   DISCHARGE DIAGNOSES: 1.  Chest pain, likely musculoskeletal in nature.  2.  Diabetes. 3.  Morbid obesity. 4.  Hypertension.   DIET: The patient is being discharged on an American Diabetic Association, low sodium, low fat diet.   ACTIVITY: As tolerated.   DISCHARGE FOLLOWUP: With primary care physician, Dr. Darreld McleanLinda Miles, in the next 1 to 2 weeks.  DISCHARGE MEDICATIONS: 1.  Hydrochlorothiazide/lisinopril 12.5/20 mg 1 tab daily. 2.  Glipizide 5 mg b.i.d. 3.  Amlodipine 5 mg daily. 4.  Skelaxin 800 mg 1 tab q.i.d.  5.  Metformin 1000 mg b.i.d.   PERTINENT STUDIES DURING HOSPITAL COURSE: CT of the chest done with contrast on admission showing no evidence of any acute pulmonary emboli. A chest x-ray done on admission showing no acute cardiopulmonary disease with shallow inspiration. A nuclear medicine stress test done showing normal study with no evidence of ischemia or infarct. Normal LV function with EF of 64%.   BRIEF HOSPITAL COURSE: This is a 59 year old male with past medical history of diabetes, hypertension and morbid obesity who presented to the hospital with chest pain.  1.  Chest pain. Given the patient's significant risk factors of morbid obesity, diabetes and hyperlipidemia, the patient was observed overnight on telemetry, had 3 sets of cardiac markers checked, which were negative. He also underwent a nuclear medicine Lexiscan stress test which showed no evidence of any acute myocardial ischemia with normal LV function. His chest pain prior to discharge had completely resolved. He was therefore discharged on his home medications with close followup with his primary care physician as an outpatient. If the patient continues to have persistent  chest pain, the next step would be to do a cardiac catheterization.  2.  Diabetes. The patient's hemoglobin A1c was 9; therefore, his diabetes is not very well controlled. He will continue his glipizide and metformin as stated, although his metformin needs to be held, as he had a nuclear stress test, for the next 48 hours.  3.  Hypertension. The patient remained hemodynamically stable in the hospital. He will continue his hydrochlorothiazide, lisinopril and amlodipine upon discharge.   CODE STATUS: The patient is a FULL CODE.   TIME SPENT ON DISCHARGE:  35 minutes. ____________________________ Rolly PancakeVivek J. Cherlynn KaiserSainani, MD vjs:sb D: 06/11/2012 07:56:02 ET T: 06/11/2012 08:02:40 ET JOB#: [id number]  cc: Rolly PancakeVivek J. Cherlynn KaiserSainani, MD, <Dictator> Leanna SatoLinda [name]. Miles, MD [place] SirenVIVEK J Kasey Ewings MD ELECTRONICALLY SIGNED 06/18/2012 8:14

## 2014-12-20 ENCOUNTER — Other Ambulatory Visit: Payer: Self-pay | Admitting: Physician Assistant

## 2014-12-20 ENCOUNTER — Encounter (HOSPITAL_BASED_OUTPATIENT_CLINIC_OR_DEPARTMENT_OTHER): Payer: Self-pay | Admitting: *Deleted

## 2014-12-20 NOTE — H&P (Signed)
This is a 59 year-old gentleman who comes in with continued right lateral hip pain following a traumatic fall, which occurred at his workplace in September of 2015.  Jaleel has struggled with right lateral hip pain since that point in time.  This has all been a picture of greater trochanteric bursitis.  This has been injected twice in the past with the last injection being approximately two weeks ago under ultrasound.  He states that this injection has really dulled his pain, but nothing terribly more.  He continues to work on iliotibial band exercises.  Overall the pain in the lateral aspect of his hip has gotten a little better.  He does admit to a little bit of groin pain, but this seems to radiate from his lateral hip.   Past medical, social and family history reviewed in detail on the patient questionnaire and signed.  Review of systems: As detailed in HPI.  All others reviewed and are negative.   EXAMINATION: Well-developed, well-nourished male in no acute distress.  Alert and oriented x 3.  Examination of his right hip reveals marked tenderness to palpation over the greater trochanter.  Negative log roll.  Negative straight leg raise.  He is neurovascularly intact distally.    IMPRESSION: Traumatic trochanteric bursitis, right hip.    PLAN: Today we discussed possible re-injection, as well as surgical intervention for bursectomy and iliotibial band release.  We feel it is best, because he is having some relief, that we give this a little more time.  He is to continue working on exercises at this point in time.  He is to follow up with [place] in one month's time and if he is not any better or is getting worse we may possibly re-inject this.    Loreta Ave, M.D.  Addendum:   Bohdi comes in for follow up.  Continued inexorable symptoms from his traumatic injury, right hip.  Only transient improvement with injection.  Attempts to stretch out his iliotibial band make him worse rather than better.   Recently helping somebody in a wheelchair where he was bending over and trying to lift them up.  Excruciating pain.  Following that he had some tingling down to his right heel.  Really denies significant back pain.  He comes in to discuss definitive treatment.  I spent more than 25 minutes with him face-to-face.  I don't think further efforts at stretching or injection is going to be a long-term resolution.  We have discussed definitive treatment with trochanteric bursectomy and iliotibial band release.  Procedure, risks, benefits and complications reviewed in detail.  Out of work 6-12 weeks post-op.  This will be done on an outpatient basis.  I have completed all paperwork and answered all of his questions.  He is going to try to continue working through his symptoms in the interim.  I will see him at the time of operative intervention.    Loreta Ave, M.D.

## 2014-12-21 ENCOUNTER — Encounter (HOSPITAL_BASED_OUTPATIENT_CLINIC_OR_DEPARTMENT_OTHER)
Admission: RE | Admit: 2014-12-21 | Discharge: 2014-12-21 | Disposition: A | Discharge status: Home / Self Care | Payer: Self-pay | Source: Ambulatory Visit | Attending: Orthopedic Surgery | Admitting: Orthopedic Surgery

## 2014-12-21 DIAGNOSIS — Z6841 Body Mass Index (BMI) 40.0 and over, adult: Secondary | ICD-10-CM | POA: Diagnosis not present

## 2014-12-21 DIAGNOSIS — M7061 Trochanteric bursitis, right hip: Secondary | ICD-10-CM | POA: Diagnosis present

## 2014-12-21 DIAGNOSIS — I10 Essential (primary) hypertension: Secondary | ICD-10-CM | POA: Diagnosis not present

## 2014-12-21 DIAGNOSIS — M24551 Contracture, right hip: Secondary | ICD-10-CM | POA: Diagnosis not present

## 2014-12-21 LAB — BASIC METABOLIC PANEL
Anion gap: 5 (ref 5–15)
BUN: 11 mg/dL (ref 6–20)
CO2: 29 mmol/L (ref 22–32)
Calcium: 9.2 mg/dL (ref 8.9–10.3)
Chloride: 103 mmol/L (ref 101–111)
Creatinine, Ser: 0.85 mg/dL (ref 0.61–1.24)
GFR calc Af Amer: >60 mL/min (ref >60)
GFR calc non Af Amer: >60 mL/min (ref >60)
Glucose, Bld: 100 mg/dL — ABNORMAL HIGH (ref 65–99)
Potassium: 4.2 mmol/L (ref 3.5–5.1)
Sodium: 137 mmol/L (ref 135–145)

## 2014-12-23 ENCOUNTER — Encounter (HOSPITAL_BASED_OUTPATIENT_CLINIC_OR_DEPARTMENT_OTHER): Payer: Self-pay | Admitting: *Deleted

## 2014-12-23 ENCOUNTER — Ambulatory Visit (HOSPITAL_BASED_OUTPATIENT_CLINIC_OR_DEPARTMENT_OTHER)
Admission: RE | Admit: 2014-12-23 | Discharge: 2014-12-23 | Disposition: A | Discharge status: Home / Self Care | Payer: Worker's Compensation | Source: Ambulatory Visit | Attending: Orthopedic Surgery | Admitting: Orthopedic Surgery

## 2014-12-23 ENCOUNTER — Encounter (HOSPITAL_BASED_OUTPATIENT_CLINIC_OR_DEPARTMENT_OTHER)
Admission: RE | Disposition: A | Discharge status: Home / Self Care | Payer: Self-pay | Source: Ambulatory Visit | Attending: Orthopedic Surgery

## 2014-12-23 ENCOUNTER — Ambulatory Visit (HOSPITAL_BASED_OUTPATIENT_CLINIC_OR_DEPARTMENT_OTHER): Payer: Worker's Compensation | Admitting: Anesthesiology

## 2014-12-23 DIAGNOSIS — M24551 Contracture, right hip: Secondary | ICD-10-CM | POA: Diagnosis not present

## 2014-12-23 DIAGNOSIS — I10 Essential (primary) hypertension: Secondary | ICD-10-CM | POA: Diagnosis not present

## 2014-12-23 DIAGNOSIS — M7061 Trochanteric bursitis, right hip: Secondary | ICD-10-CM | POA: Insufficient documentation

## 2014-12-23 DIAGNOSIS — Z6841 Body Mass Index (BMI) 40.0 and over, adult: Secondary | ICD-10-CM | POA: Insufficient documentation

## 2014-12-23 HISTORY — DX: Major depressive disorder, single episode, unspecified: F32.9

## 2014-12-23 HISTORY — PX: EXCISION/RELEASE BURSA HIP: SHX5014

## 2014-12-23 HISTORY — DX: Anxiety disorder, unspecified: F41.9

## 2014-12-23 HISTORY — PX: ILIOTIBIAL BAND RELEASE: SHX675

## 2014-12-23 HISTORY — DX: Depression, unspecified: F32.A

## 2014-12-23 HISTORY — DX: Trochanteric bursitis, right hip: M70.61

## 2014-12-23 LAB — GLUCOSE, CAPILLARY
Glucose-Capillary: 118 mg/dL — ABNORMAL HIGH (ref 65–99)
Glucose-Capillary: 98 mg/dL (ref 65–99)
Glucose-Capillary: 119 mg/dL — ABNORMAL HIGH (ref 65–99)

## 2014-12-23 LAB — POCT HEMOGLOBIN-HEMACUE: Hemoglobin: 15.4 g/dL (ref 13.0–17.0)

## 2014-12-23 SURGERY — RELEASE, ILIOTIBIAL BAND
Anesthesia: General | Site: Hip | Laterality: Right

## 2014-12-23 MED ORDER — SUCCINYLCHOLINE CHLORIDE 20 MG/ML IJ SOLN
INTRAMUSCULAR | Status: DC | PRN
  Administered 2014-12-23: 100 mg via INTRAVENOUS

## 2014-12-23 MED ORDER — BUPIVACAINE HCL (PF) 0.5 % IJ SOLN
INTRAMUSCULAR | Status: AC
  Filled 2014-12-23: qty 30

## 2014-12-23 MED ORDER — LACTATED RINGERS IV SOLN
INTRAVENOUS | Status: DC
  Administered 2014-12-23: 12:00:00 via INTRAVENOUS
  Administered 2014-12-23: 10 mL/h via INTRAVENOUS
  Administered 2014-12-23: 13:00:00 via INTRAVENOUS

## 2014-12-23 MED ORDER — BUPIVACAINE LIPOSOME 1.3 % IJ SUSP
INTRAMUSCULAR | Status: AC
  Filled 2014-12-23: qty 20

## 2014-12-23 MED ORDER — MIDAZOLAM HCL 2 MG/2ML IJ SOLN
1.0000 mg | INTRAMUSCULAR | Status: DC | PRN
Start: 2014-12-23 — End: 2014-12-23
  Administered 2014-12-23: 2 mg via INTRAVENOUS

## 2014-12-23 MED ORDER — OXYCODONE-ACETAMINOPHEN 5-325 MG PO TABS: 1.0000 | ORAL_TABLET | ORAL | Status: DC | PRN

## 2014-12-23 MED ORDER — PROMETHAZINE HCL 25 MG/ML IJ SOLN: 6.2500 mg | INTRAMUSCULAR | Status: DC | PRN

## 2014-12-23 MED ORDER — BUPIVACAINE-EPINEPHRINE (PF) 0.5% -1:200000 IJ SOLN
INTRAMUSCULAR | Status: AC
  Filled 2014-12-23: qty 30

## 2014-12-23 MED ORDER — DEXAMETHASONE SODIUM PHOSPHATE 4 MG/ML IJ SOLN
INTRAMUSCULAR | Status: DC | PRN
  Administered 2014-12-23: 5 mg via INTRAVENOUS

## 2014-12-23 MED ORDER — HYDROMORPHONE HCL 1 MG/ML IJ SOLN
0.2500 mg | INTRAMUSCULAR | Status: DC | PRN
  Administered 2014-12-23: 0.5 mg via INTRAVENOUS

## 2014-12-23 MED ORDER — OXYCODONE HCL 5 MG PO TABS
5.0000 mg | ORAL_TABLET | Freq: Once | ORAL | Status: AC
  Administered 2014-12-23: 5 mg via ORAL

## 2014-12-23 MED ORDER — BUPIVACAINE HCL (PF) 0.25 % IJ SOLN
INTRAMUSCULAR | Status: AC
  Filled 2014-12-23: qty 90

## 2014-12-23 MED ORDER — ONDANSETRON HCL 4 MG/2ML IJ SOLN
INTRAMUSCULAR | Status: DC | PRN
  Administered 2014-12-23: 4 mg via INTRAVENOUS

## 2014-12-23 MED ORDER — HYDROMORPHONE HCL 1 MG/ML IJ SOLN
INTRAMUSCULAR | Status: AC
  Filled 2014-12-23: qty 1

## 2014-12-23 MED ORDER — PROPOFOL 10 MG/ML IV BOLUS
INTRAVENOUS | Status: AC
  Filled 2014-12-23: qty 20

## 2014-12-23 MED ORDER — FENTANYL CITRATE (PF) 100 MCG/2ML IJ SOLN
50.0000 ug | INTRAMUSCULAR | Status: AC | PRN
  Administered 2014-12-23 (×4): 50 ug via INTRAVENOUS

## 2014-12-23 MED ORDER — MIDAZOLAM HCL 2 MG/2ML IJ SOLN
INTRAMUSCULAR | Status: AC
  Filled 2014-12-23: qty 4

## 2014-12-23 MED ORDER — LACTATED RINGERS IV SOLN: INTRAVENOUS | Status: DC

## 2014-12-23 MED ORDER — GLYCOPYRROLATE 0.2 MG/ML IJ SOLN: 0.2000 mg | Freq: Once | INTRAMUSCULAR | Status: DC | PRN

## 2014-12-23 MED ORDER — CEFAZOLIN SODIUM-DEXTROSE 2-3 GM-% IV SOLR
2.0000 g | INTRAVENOUS | Status: AC
  Administered 2014-12-23: 2 g via INTRAVENOUS

## 2014-12-23 MED ORDER — BUPIVACAINE HCL (PF) 0.5 % IJ SOLN
INTRAMUSCULAR | Status: DC | PRN
  Administered 2014-12-23: 20 mL

## 2014-12-23 MED ORDER — PROPOFOL 10 MG/ML IV BOLUS
INTRAVENOUS | Status: DC | PRN
  Administered 2014-12-23: 200 mg via INTRAVENOUS

## 2014-12-23 MED ORDER — CEFAZOLIN SODIUM-DEXTROSE 2-3 GM-% IV SOLR
INTRAVENOUS | Status: AC
  Filled 2014-12-23: qty 50

## 2014-12-23 MED ORDER — CHLORHEXIDINE GLUCONATE 4 % EX LIQD: 60.0000 mL | Freq: Once | CUTANEOUS | Status: DC

## 2014-12-23 MED ORDER — BUPIVACAINE-EPINEPHRINE (PF) 0.25% -1:200000 IJ SOLN
INTRAMUSCULAR | Status: AC
  Filled 2014-12-23: qty 30

## 2014-12-23 MED ORDER — EPHEDRINE SULFATE 50 MG/ML IJ SOLN
INTRAMUSCULAR | Status: DC | PRN
  Administered 2014-12-23: 10 mg via INTRAVENOUS

## 2014-12-23 MED ORDER — FENTANYL CITRATE (PF) 100 MCG/2ML IJ SOLN
INTRAMUSCULAR | Status: AC
  Filled 2014-12-23: qty 4

## 2014-12-23 MED ORDER — SCOPOLAMINE 1 MG/3DAYS TD PT72: 1.0000 | MEDICATED_PATCH | Freq: Once | TRANSDERMAL | Status: DC | PRN

## 2014-12-23 MED ORDER — OXYCODONE HCL 5 MG PO TABS
ORAL_TABLET | ORAL | Status: AC
  Filled 2014-12-23: qty 1

## 2014-12-23 MED ORDER — ONDANSETRON HCL 4 MG PO TABS: 4.0000 mg | ORAL_TABLET | Freq: Three times a day (TID) | ORAL | Status: DC | PRN

## 2014-12-23 SURGICAL SUPPLY — 55 items
APL SKNCLS STERI-STRIP NONHPOA (GAUZE/BANDAGES/DRESSINGS)
BENZOIN TINCTURE PRP APPL 2/3 (GAUZE/BANDAGES/DRESSINGS) IMPLANT
BLADE SURG 15 STRL LF DISP TIS (BLADE) ×1 IMPLANT
BLADE SURG 15 STRL SS (BLADE) ×3
CANISTER SUCT 1200ML W/VALVE (MISCELLANEOUS) ×3 IMPLANT
CLOSURE WOUND 1/2 X4 (GAUZE/BANDAGES/DRESSINGS)
DECANTER SPIKE VIAL GLASS SM (MISCELLANEOUS) IMPLANT
DRAPE INCISE IOBAN 66X45 STRL (DRAPES) ×3 IMPLANT
DRAPE LAPAROTOMY 100X72 PEDS (DRAPES) ×3 IMPLANT
DRAPE U-SHAPE 47X51 STRL (DRAPES) ×3 IMPLANT
DRAPE U-SHAPE 76X120 STRL (DRAPES) ×1 IMPLANT
DRSG MEPILEX BORDER 4X8 (GAUZE/BANDAGES/DRESSINGS) ×2 IMPLANT
DRSG PAD ABDOMINAL 8X10 ST (GAUZE/BANDAGES/DRESSINGS) ×6 IMPLANT
DURAPREP 26ML APPLICATOR (WOUND CARE) ×3 IMPLANT
ELECT REM PT RETURN 9FT ADLT (ELECTROSURGICAL) ×3
ELECTRODE REM PT RTRN 9FT ADLT (ELECTROSURGICAL) ×1 IMPLANT
GAUZE SPONGE 4X4 12PLY STRL (GAUZE/BANDAGES/DRESSINGS) ×1 IMPLANT
GAUZE SPONGE 4X4 16PLY XRAY LF (GAUZE/BANDAGES/DRESSINGS) IMPLANT
GAUZE XEROFORM 1X8 LF (GAUZE/BANDAGES/DRESSINGS) IMPLANT
GLOVE BIO SURGEON STRL SZ 6.5 (GLOVE) ×1 IMPLANT
GLOVE BIO SURGEONS STRL SZ 6.5 (GLOVE) ×1
GLOVE BIOGEL PI IND STRL 7.0 (GLOVE) ×1 IMPLANT
GLOVE BIOGEL PI INDICATOR 7.0 (GLOVE) ×4
GLOVE ECLIPSE 7.0 STRL STRAW (GLOVE) ×3 IMPLANT
GLOVE ORTHO TXT STRL SZ7.5 (GLOVE) ×6 IMPLANT
GLOVE SURG ORTHO 8.0 STRL STRW (GLOVE) ×3 IMPLANT
GOWN STRL REUS W/ TWL LRG LVL3 (GOWN DISPOSABLE) ×2 IMPLANT
GOWN STRL REUS W/ TWL XL LVL3 (GOWN DISPOSABLE) ×1 IMPLANT
GOWN STRL REUS W/TWL LRG LVL3 (GOWN DISPOSABLE) ×6
GOWN STRL REUS W/TWL XL LVL3 (GOWN DISPOSABLE) ×6 IMPLANT
NEEDLE HYPO 22GX1.5 SAFETY (NEEDLE) ×2 IMPLANT
NS IRRIG 1000ML POUR BTL (IV SOLUTION) ×3 IMPLANT
PACK ARTHROSCOPY DSU (CUSTOM PROCEDURE TRAY) ×3 IMPLANT
PACK BASIN DAY SURGERY FS (CUSTOM PROCEDURE TRAY) ×3 IMPLANT
PENCIL BUTTON HOLSTER BLD 10FT (ELECTRODE) ×3 IMPLANT
SLEEVE SCD COMPRESS KNEE MED (MISCELLANEOUS) ×2 IMPLANT
SPONGE LAP 18X18 X RAY DECT (DISPOSABLE) ×2 IMPLANT
SPONGE LAP 4X18 X RAY DECT (DISPOSABLE) IMPLANT
STAPLER VISISTAT 35W (STAPLE) IMPLANT
STRIP CLOSURE SKIN 1/2X4 (GAUZE/BANDAGES/DRESSINGS) IMPLANT
SUT PROLENE 3 0 PS 2 (SUTURE) IMPLANT
SUT VIC AB 0 CT1 27 (SUTURE) ×3
SUT VIC AB 0 CT1 27XBRD ANBCTR (SUTURE) IMPLANT
SUT VIC AB 2-0 CT1 27 (SUTURE) ×3
SUT VIC AB 2-0 CT1 TAPERPNT 27 (SUTURE) IMPLANT
SUT VIC AB 2-0 PS2 27 (SUTURE) ×4 IMPLANT
SUT VIC AB 2-0 SH 27 (SUTURE)
SUT VIC AB 2-0 SH 27XBRD (SUTURE) IMPLANT
SUT VIC AB 3-0 PS1 18 (SUTURE)
SUT VIC AB 3-0 PS1 18XBRD (SUTURE) IMPLANT
SUT VIC AB 3-0 SH 27 (SUTURE)
SUT VIC AB 3-0 SH 27X BRD (SUTURE) IMPLANT
SYR BULB 3OZ (MISCELLANEOUS) ×3 IMPLANT
SYR CONTROL 10ML LL (SYRINGE) ×2 IMPLANT
YANKAUER SUCT BULB TIP NO VENT (SUCTIONS) ×3 IMPLANT

## 2014-12-23 NOTE — Anesthesia Preprocedure Evaluation (Addendum)
Anesthesia Evaluation  Patient identified by MRN, date of birth, ID band Patient awake    Reviewed: Allergy & Precautions, NPO status , Patient's Chart, lab work & pertinent test results  Airway Mallampati: II  TM Distance: <3 FB Neck ROM: Full    Dental no notable dental hx.    Pulmonary neg pulmonary ROS,    Pulmonary exam normal breath sounds clear to auscultation       Cardiovascular hypertension, Pt. on medications Normal cardiovascular exam Rhythm:Regular Rate:Normal     Neuro/Psych negative neurological ROS  negative psych ROS   GI/Hepatic negative GI ROS, Neg liver ROS,   Endo/Other  diabetesMorbid obesity  Renal/GU negative Renal ROS  negative genitourinary   Musculoskeletal negative musculoskeletal ROS (+)   Abdominal   Peds negative pediatric ROS (+)  Hematology negative hematology ROS (+)   Anesthesia Other Findings   Reproductive/Obstetrics negative OB ROS                            Anesthesia Physical Anesthesia Plan  ASA: III  Anesthesia Plan: General   Post-op Pain Management:    Induction: Intravenous  Airway Management Planned: Oral ETT  Additional Equipment:   Intra-op Plan:   Post-operative Plan: Extubation in OR  Informed Consent: I have reviewed the patients History and Physical, chart, labs and discussed the procedure including the risks, benefits and alternatives for the proposed anesthesia with the patient or authorized representative who has indicated his/her understanding and acceptance.   Dental advisory given  Plan Discussed with: CRNA and Surgeon  Anesthesia Plan Comments:         Anesthesia Quick Evaluation  

## 2014-12-23 NOTE — Discharge Instructions (Signed)
Do not remove bandage.  Do not get bandage wet.  May apply ice for up to 20 minutes at a time for pain and swelling.  Follow up appointment in one week.  ° °SEEK MEDICAL CARE IF: °You have swelling of your calf or leg.  °You develop shortness of breath or chest pain.  °You have redness, swelling, or increasing pain in the wound.  °There is pus or any unusual drainage coming from the surgical site.  °You notice a bad smell coming from the surgical site or dressing.  °The surgical site breaks open after sutures or staples have been removed.  °There is persistent bleeding from the suture or staple line.  °You are getting worse or are not improving.  °You have any other questions or concerns.  °SEEK IMMEDIATE MEDICAL CARE IF:  °You have a fever.  °You develop a rash.  °You have difficulty breathing.  °You develop any reaction or side effects to medicines given.  °Your knee motion is decreasing rather than improving.  °MAKE SURE YOU:  °Understand these instructions.  °Will watch your condition.  °Will get help right away if you are not doing well or get worse.  ° ° °Post Anesthesia Home Care Instructions ° °Activity: °Get plenty of rest for the remainder of the day. A responsible adult should stay with you for 24 hours following the procedure.  °For the next 24 hours, DO NOT: °-Drive a car °-Operate machinery °-Drink alcoholic beverages °-Take any medication unless instructed by your physician °-Make any legal decisions or sign important papers. ° °Meals: °Start with liquid foods such as gelatin or soup. Progress to regular foods as tolerated. Avoid greasy, spicy, heavy foods. If nausea and/or vomiting occur, drink only clear liquids until the nausea and/or vomiting subsides. Call your physician if vomiting continues. ° °Special Instructions/Symptoms: °Your throat may feel dry or sore from the anesthesia or the breathing tube placed in your throat during surgery. If this causes discomfort, gargle with warm salt water.  The discomfort should disappear within 24 hours. ° °If you had a scopolamine patch placed behind your ear for the management of post- operative nausea and/or vomiting: ° °1. The medication in the patch is effective for 72 hours, after which it should be removed.  Wrap patch in a tissue and discard in the trash. Wash hands thoroughly with soap and water. °2. You may remove the patch earlier than 72 hours if you experience unpleasant side effects which may include dry mouth, dizziness or visual disturbances. °3. Avoid touching the patch. Wash your hands with soap and water after contact with the patch. °  ° °

## 2014-12-23 NOTE — Anesthesia Procedure Notes (Signed)
Procedure Name: Intubation Date/Time: 12/23/2014 11:30 AM Performed by: Burna Cash Pre-anesthesia Checklist: Patient identified, Emergency Drugs available, Suction available and Patient being monitored Patient Re-evaluated:Patient Re-evaluated prior to inductionOxygen Delivery Method: Circle System Utilized Preoxygenation: Pre-oxygenation with 100% oxygen Intubation Type: IV induction Ventilation: Mask ventilation without difficulty Laryngoscope Size: Mac and 3 Grade View: Grade I Tube type: Oral Tube size: 8.0 mm Number of attempts: 1 Airway Equipment and Method: Stylet and Oral airway Placement Confirmation: ETT inserted through vocal cords under direct vision,  positive ETCO2 and breath sounds checked- equal and bilateral Secured at: 22 cm Tube secured with: Tape Dental Injury: Teeth and Oropharynx as per pre-operative assessment

## 2014-12-23 NOTE — Interval H&P Note (Signed)
History and Physical Interval Note:  12/23/2014 7:30 AM  Lance [name]  has presented today for surgery, with the diagnosis of TROCHANTERIC BURSITIS RIGHT HIP  The various methods of treatment have been discussed with the patient and family. After consideration of risks, benefits and other options for treatment, the patient has consented to  Procedure(s): RIGHT HIP ILIO-TIBIAL BAND RELEASE (Right) RIGHT RESECTION GREATER TROCHANTERIC BURSA (Right) as a surgical intervention .  The patient's history has been reviewed, patient examined, no change in status, stable for surgery.  I have reviewed the patient's chart and labs.  Questions were answered to the patient's satisfaction.     Loreta Ave

## 2014-12-23 NOTE — Transfer of Care (Signed)
Immediate Anesthesia Transfer of Care Note  Patient: Lance [name]  Procedure(s) Performed: Procedure(s): RIGHT HIP ILIO-TIBIAL BAND RELEASE (Right) RIGHT RESECTION GREATER TROCHANTERIC BURSA (Right)  Patient Location: PACU  Anesthesia Type:General  Level of Consciousness: awake, alert  and oriented  Airway & Oxygen Therapy: Patient Spontanous Breathing and Patient connected to face mask oxygen  Post-op Assessment: Report given to RN and Post -op Vital signs reviewed and stable  Post vital signs: Reviewed and stable  Last Vitals:  Filed Vitals:   12/23/14 1236  BP:   Pulse: 92  Temp:   Resp:     Complications: No apparent anesthesia complications

## 2014-12-23 NOTE — Anesthesia Postprocedure Evaluation (Signed)
  Anesthesia Post-op Note  Patient: Lance [name]  Procedure(s) Performed: Procedure(s): RIGHT HIP ILIO-TIBIAL BAND RELEASE (Right) RIGHT RESECTION GREATER TROCHANTERIC BURSA (Right)  Patient Location: PACU  Anesthesia Type:General  Level of Consciousness: awake and alert   Airway and Oxygen Therapy: Patient Spontanous Breathing  Post-op Pain: none  Post-op Assessment: Post-op Vital signs reviewed              Post-op Vital Signs: Reviewed  Last Vitals:  Filed Vitals:   12/23/14 1350  BP: 139/71  Pulse: 72  Temp: 36.4 C  Resp: 20    Complications: No apparent anesthesia complications

## 2014-12-24 ENCOUNTER — Encounter (HOSPITAL_BASED_OUTPATIENT_CLINIC_OR_DEPARTMENT_OTHER): Payer: Self-pay | Admitting: Orthopedic Surgery

## 2014-12-24 NOTE — Op Note (Signed)
NAME:  Lance [name], Lance [name]                 ACCOUNT NO.:  [id number]  MEDICAL RECORD NO.:  [id number]  LOCATION:                               FACILITY:  [facility]  PHYSICIAN:  Loreta Ave, M.D. 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  PROCEDURE:  Release of iliotibial band over greater trochanter, right hip.  Excision of underlying bursa.  SURGEON:  Loreta Ave, M.D.  ASSISTANT:  Mikey Kirschner, PA, present throughout the entire case and necessary for timely completion of procedure.  ANESTHESIA:  General.  BLOOD LOSS:  Minimal.  SPECIMENS:  None.  CULTURES:  None.  COMPLICATIONS:  None.  DRESSINGS:  Soft compressive.  DESCRIPTION OF PROCEDURE:  The patient was brought to the operating room, placed on the operating table in a supine position.  After adequate anesthesia had been obtained, turned to a lateral position, appropriately padding and support.  Prepped and draped in usual sterile fashion.  A longitudinal incision over the greater trochanter.  Skin and subcutaneous tissue divided.  Nearly 3 inches of adipose tissue before I got down to the iliotibial band.  This was incised generously in a longitudinal manner from well above to well below the greater trochanter.  A small cruciate incision in the band right over the trochanter.  Underlying bursa resected.  No evidence of adductor tear. No evidence of infection.  Wound irrigated.  A layered closure of the subcutaneous tissue and skin.  Margins were injected with Marcaine. Sterile compressive dressing applied.  Returned to the  supine position. Anesthesia reversed.  Brought to the recovery room.  Tolerated the surgery well.  No complications.     Loreta Ave, M.D.     DFM/MEDQ  D:  12/23/2014  T:  12/24/2014  Job:  [id number]

## 2017-09-16 NOTE — Progress Notes (Signed)
09/17/2017 9:33 AM   Lance [name] Feb 16, 1956 [id number]  Referring provider: Josefina Do., MD [address] [place], [place] [address]  Chief Complaint  Patient presents with  . Establish Care    HPI: Patient is a 62 year old male who was referred by Dr. Renaee Munda for penile discharge.  He sought treatment last week with Dr. Juanetta Gosling for a 4-week history of urinary frequency and penile discharge.  This happened after he received a cortisol injection in his hip.  He states he had a similar episode in September which resolved with antibiotic.  Chlamydia and gonorrhea cultures were negative on September 08, 2017.  He also states he is having issues with erectile dysfunction.   He states he is married, but he has not had sexual relations in over four years.  He had has ED for the last two and half years.  He has not taken anything for his erections.  He is getting spontaneous erections an occasion.    Today, he is experiencing frequency, pain at the tip of penis with urination, nocturia and leakage of urine stream.  This happens with the discharge.  Patient denies any gross hematuria, dysuria or suprapubic/flank pain.  Patient denies any fevers, chills, nausea or vomiting.   2 cups of coffee daily.   He drinks a lot of water.  He state his HbgA1c was high, but it is getting lower.  He says it is time to get it rechecked.   PMH: Past Medical History:  Diagnosis Date  . Anxiety   . Arthritis    knees  . Complication of anesthesia 2009   during shoulder block-ht rate dropped-had injection-did do surgery-  . Depression   . Diabetes mellitus   . Hyperlipidemia   . Hypertension   . Trochanteric bursitis of right hip     Surgical History: Past Surgical History:  Procedure Laterality Date  . CARDIAC CATHETERIZATION  2004   normal  . EXCISION/RELEASE BURSA HIP Right 12/23/2014   Procedure: RIGHT RESECTION GREATER TROCHANTERIC BURSA;  Surgeon: Loreta Ave, MD;   Location: [facility];  Service: Orthopedics;  Laterality: Right;  . ILIOTIBIAL BAND RELEASE Right 12/23/2014   Procedure: RIGHT HIP ILIO-TIBIAL BAND RELEASE;  Surgeon: Loreta Ave, MD;  Location: [facility];  Service: Orthopedics;  Laterality: Right;  . KNEE ARTHROSCOPY  2006   lt  . KNEE ARTHROSCOPY  03/08/2011   Procedure: ARTHROSCOPY KNEE;  Surgeon: Loreta Ave, MD;  Location:  SURGERY CENTER;  Service: Orthopedics;  Laterality: Left;  left knee arthroscopy  anterior horn  medial meniscus cyst  . SHOULDER ARTHROSCOPY  2009   lt  . SHOULDER ARTHROSCOPY  2006   right  . TONSILLECTOMY      Home Medications:  Allergies as of 09/17/2017   No Known Allergies     Medication List        Accurate as of 09/17/17  9:33 AM. Always use your most recent med list.          amLODipine 5 MG tablet Commonly known as:  NORVASC Take 5 mg by mouth daily.   ibuprofen 200 MG tablet Commonly known as:  ADVIL,MOTRIN Take by mouth.   linagliptin 5 MG Tabs tablet Commonly known as:  TRADJENTA Take 5 mg by mouth daily.   lisinopril-hydrochlorothiazide 20-12.5 MG tablet Commonly known as:  PRINZIDE,ZESTORETIC Take 1 tablet by mouth daily.   metFORMIN 500 MG 24 hr tablet  Commonly known as:  GLUCOPHAGE-XR   nystatin cream Commonly known as:  MYCOSTATIN Apply 1 application topically 2 (two) times daily.       Allergies: No Known Allergies  Family History: No family history on file.  Social History:  reports that he has never smoked. He has never used smokeless tobacco. He reports that he does not drink alcohol or use drugs.  ROS: UROLOGY Frequent Urination?: Yes Hard to postpone urination?: No Burning/pain with urination?: Yes Get up at night to urinate?: Yes Leakage of urine?: Yes Urine stream starts and stops?: No Trouble starting stream?: No Do you have to strain to urinate?: No Blood in urine?: No Urinary tract infection?:  Yes Sexually transmitted disease?: No Injury to kidneys or bladder?: No Painful intercourse?: No Weak stream?: Yes Erection problems?: Yes Penile pain?: No  Gastrointestinal Nausea?: No Vomiting?: No Indigestion/heartburn?: No Diarrhea?: No Constipation?: No  Constitutional Fever: No Night sweats?: No Weight loss?: No Fatigue?: No  Skin Skin rash/lesions?: No Itching?: No  Eyes Blurred vision?: Yes Double vision?: No  Ears/Nose/Throat Sore throat?: No Sinus problems?: No  Hematologic/Lymphatic Swollen glands?: No Easy bruising?: No  Cardiovascular Leg swelling?: No Chest pain?: No  Respiratory Cough?: No Shortness of breath?: No  Endocrine Excessive thirst?: No  Musculoskeletal Back pain?: Yes Joint pain?: No  Neurological Headaches?: No Dizziness?: No  Psychologic Depression?: No Anxiety?: No  Physical Exam: BP 132/78 (BP Location: Right Arm, Patient Position: Sitting, Cuff Size: Large)   Pulse 87   Ht 5\' 7"  (1.702 m)   Wt 264 lb 8 oz (120 kg)   BMI 41.43 kg/m   Constitutional:  Well nourished. Alert and oriented, No acute distress. HEENT: [place] AT, moist mucus membranes.  Trachea midline, no masses. Cardiovascular: No clubbing, cyanosis, or edema. Respiratory: Normal respiratory effort, no increased work of breathing. GI: Abdomen is soft, non tender, non distended, no abdominal masses. Liver and spleen not palpable.  No hernias appreciated.  Stool sample for occult testing is not indicated.   GU: No CVA tenderness.  No bladder fullness or masses.  Patient with uncircumcised phallus.  Foreskin easily retracted.  His frenulum tight.  Urethral meatus is patent.  No penile discharge. No penile lesions or rashes. Scrotum without lesions, cysts, rashes and/or edema.  Testicles are located scrotally bilaterally. No masses are appreciated in the testicles. Left and right epididymis are normal. Rectal: Patient with  normal sphincter tone. Anus and  perineum without scarring or rashes. No rectal masses are appreciated. Prostate is approximately 50 grams, no nodules are appreciated. Seminal vesicles are normal. Skin: No rashes, bruises or suspicious lesions. Lymph: No cervical or inguinal adenopathy. Neurologic: Grossly intact, no focal deficits, moving all 4 extremities. Psychiatric: Normal mood and affect.  Laboratory Data: Lab Results  Component Value Date   WBC 7.2 05/14/2013   HGB 15.4 12/23/2014   HCT 42.0 05/14/2013   MCV 86 05/14/2013   PLT 167 05/14/2013    Lab Results  Component Value Date   CREATININE 0.85 12/21/2014    No results found for: PSA  No results found for: TESTOSTERONE  Lab Results  Component Value Date   HGBA1C 9.1 (H) 06/09/2012    No results found for: TSH     Component Value Date/Time   CHOL 155 06/09/2012 1202   HDL 34 (L) 06/09/2012 1202   VLDL 26 06/09/2012 1202   LDLCALC 95 06/09/2012 1202    Lab Results  Component Value Date   AST 41 (H)  05/14/2013   Lab Results  Component Value Date   ALT 30 05/14/2013   No components found for: ALKALINEPHOPHATASE No components found for: BILIRUBINTOTAL  No results found for: ESTRADIOL  Urinalysis    Component Value Date/Time   COLORURINE Straw 05/14/2013 1025   APPEARANCEUR Clear 05/14/2013 1025   LABSPEC 1.003 05/14/2013 1025   PHURINE 6.0 05/14/2013 1025   GLUCOSEU >=500 05/14/2013 1025   HGBUR Negative 05/14/2013 1025   BILIRUBINUR Negative 05/14/2013 1025   KETONESUR Negative 05/14/2013 1025   PROTEINUR Negative 05/14/2013 1025   NITRITE Negative 05/14/2013 1025   LEUKOCYTESUR Negative 05/14/2013 1025    I have reviewed the labs.  Assessment & Plan:    1. Balanitis Explained to the patient that because he has gained weight his penis has become buried and therefore he has the excess skin covering his glans and with his diabetes it puts him at risk for balanitis Patient is instructed to pull back the foreskin and clean  the glans with soapy water and dry the glans thoroughly and apply the Nystatin cream bid We discussed circumcision and I advised the patient that he would need to get his sugars under good control before we can consider this option RTC in two weeks for recheck  2. BPH with LUTS Continue conservative management, avoiding bladder irritants and timed voiding's PSA drawn today   3. Erectile dysfunction I explained that conditions like diabetes and BPH can diminish the ability to have an erection A recent study published in Sex Med 2018 Apr 13 revealed moderate to vigorous aerobic exercise for 40 minutes 4 times per week can decrease erectile problems caused by physical inactivity, obesity, hypertension, metabolic syndrome and/or cardiovascular diseases  Return in about 2 weeks (around 10/01/2017) for recheck .  These notes generated with voice recognition software. I apologize for typographical errors.  Michiel Cowboy, PA-C  [facility] [address]  [address] [place], [place] [address] [phone]

## 2017-09-17 ENCOUNTER — Ambulatory Visit (INDEPENDENT_AMBULATORY_CARE_PROVIDER_SITE_OTHER): Payer: Medicare Other | Admitting: Urology

## 2017-09-17 ENCOUNTER — Encounter: Payer: Self-pay | Admitting: Urology

## 2017-09-17 VITALS — BP 132/78 | HR 87 | Ht 67.0 in | Wt 264.5 lb

## 2017-09-17 DIAGNOSIS — N401 Enlarged prostate with lower urinary tract symptoms: Secondary | ICD-10-CM | POA: Diagnosis not present

## 2017-09-17 DIAGNOSIS — N138 Other obstructive and reflux uropathy: Secondary | ICD-10-CM | POA: Diagnosis not present

## 2017-09-17 DIAGNOSIS — N481 Balanitis: Secondary | ICD-10-CM | POA: Diagnosis not present

## 2017-09-17 DIAGNOSIS — N529 Male erectile dysfunction, unspecified: Secondary | ICD-10-CM | POA: Diagnosis not present

## 2017-09-17 MED ORDER — NYSTATIN 100000 UNIT/GM EX CREA: 1.0000 "application " | TOPICAL_CREAM | Freq: Two times a day (BID) | CUTANEOUS | 0 refills | Status: DC

## 2017-09-18 ENCOUNTER — Encounter: Payer: Self-pay | Admitting: Family Medicine

## 2017-09-18 ENCOUNTER — Telehealth: Payer: Self-pay | Admitting: Family Medicine

## 2017-09-18 LAB — PSA: Prostate Specific Ag, Serum: 0.4 ng/mL (ref 0.0–4.0)

## 2017-09-18 NOTE — Telephone Encounter (Signed)
-----   Message from Harle BattiestShannon A McGowan, PA-C sent at 09/18/2017  8:38 AM EDT ----- Please let Mr. Burback know that his PSA is normal.

## 2017-09-18 NOTE — Telephone Encounter (Signed)
Letter sent.

## 2017-10-02 DIAGNOSIS — I10 Essential (primary) hypertension: Secondary | ICD-10-CM | POA: Insufficient documentation

## 2017-10-02 DIAGNOSIS — Z87442 Personal history of urinary calculi: Secondary | ICD-10-CM | POA: Insufficient documentation

## 2017-10-02 NOTE — Progress Notes (Signed)
10/03/2017 2:41 PM   Lance [name] 02-15-1956 [id number]  Referring provider: Leanna Sato, MD [address] [place], [place] [address]  Chief Complaint  Patient presents with  . Follow-up    penile infection     HPI: Patient is a 62 year old male with balanitis, BPH with LU TS and ED who presents today for follow up.    Background history Patient was referred by Dr. Renaee Munda for penile discharge.  He sought treatment last week with Dr. Juanetta Gosling for a 4-week history of urinary frequency and penile discharge.  This happened after he received a cortisol injection in his hip.  He states he had a similar episode in September which resolved with antibiotic.  Chlamydia and gonorrhea cultures were negative on September 08, 2017.  He also states he is having issues with erectile dysfunction.  He states he is married, but he has not had sexual relations in over four years.  He had has ED for the last two and half years.  He has not taken anything for his erections.  He is getting spontaneous erections an occasion.  2 cups of coffee daily.   He drinks a lot of water.  He state his HbgA1c was high, but it is getting lower.  He says it is time to get it rechecked.   Today, he states the balanitis has resolved.  His sugars have also improved.  His PSA was 0.4 in 09/2017.  Patient denies any gross hematuria, dysuria or suprapubic/flank pain.  Patient denies any fevers, chills, nausea or vomiting.   He started having spontaneous erections, but they do not last long enough for intercourse.   PMH: Past Medical History:  Diagnosis Date  . Anxiety   . Arthritis    knees  . Complication of anesthesia 2009   during shoulder block-ht rate dropped-had injection-did do surgery-  . Depression   . Diabetes mellitus   . Hyperlipidemia   . Hypertension   . Trochanteric bursitis of right hip     Surgical History: Past Surgical History:  Procedure Laterality Date  . CARDIAC CATHETERIZATION  2004     normal  . EXCISION/RELEASE BURSA HIP Right 12/23/2014   Procedure: RIGHT RESECTION GREATER TROCHANTERIC BURSA;  Surgeon: Loreta Ave, MD;  Location: [facility];  Service: Orthopedics;  Laterality: Right;  . ILIOTIBIAL BAND RELEASE Right 12/23/2014   Procedure: RIGHT HIP ILIO-TIBIAL BAND RELEASE;  Surgeon: Loreta Ave, MD;  Location: [facility];  Service: Orthopedics;  Laterality: Right;  . KNEE ARTHROSCOPY  2006   lt  . KNEE ARTHROSCOPY  03/08/2011   Procedure: ARTHROSCOPY KNEE;  Surgeon: Loreta Ave, MD;  Location: [facility];  Service: Orthopedics;  Laterality: Left;  left knee arthroscopy  anterior horn  medial meniscus cyst  . SHOULDER ARTHROSCOPY  2009   lt  . SHOULDER ARTHROSCOPY  2006   right  . TONSILLECTOMY      Home Medications:  Allergies as of 10/03/2017   No Known Allergies     Medication List        Accurate as of 10/03/17  2:41 PM. Always use your most recent med list.          amLODipine 5 MG tablet Commonly known as:  NORVASC Take 5 mg by mouth daily.   ibuprofen 200 MG tablet Commonly known as:  ADVIL,MOTRIN Take by mouth.   linagliptin 5 MG Tabs tablet Commonly known as:  TRADJENTA Take 5 mg by mouth daily.   lisinopril-hydrochlorothiazide 20-12.5 MG tablet Commonly known as:  PRINZIDE,ZESTORETIC Take 1 tablet by mouth daily.   metFORMIN 500 MG 24 hr tablet Commonly known as:  GLUCOPHAGE-XR   nystatin cream Commonly known as:  MYCOSTATIN Apply 1 application topically 2 (two) times daily.       Allergies: No Known Allergies  Family History: No family history on file.  Social History:  reports that he has never smoked. He has never used smokeless tobacco. He reports that he does not drink alcohol or use drugs.  ROS: UROLOGY Frequent Urination?: No Hard to postpone urination?: No Burning/pain with urination?: No Get up at night to urinate?: No Leakage of urine?: No Urine  stream starts and stops?: No Trouble starting stream?: No Do you have to strain to urinate?: No Blood in urine?: No Urinary tract infection?: No Sexually transmitted disease?: No Injury to kidneys or bladder?: No Painful intercourse?: No Weak stream?: No Penile pain?: No  Gastrointestinal Nausea?: No Vomiting?: No Indigestion/heartburn?: No Diarrhea?: No Constipation?: No  Constitutional Fever: No Night sweats?: No Weight loss?: No Fatigue?: No  Skin Skin rash/lesions?: No Itching?: No  Eyes Blurred vision?: No Double vision?: No  Ears/Nose/Throat Sore throat?: No Sinus problems?: No  Hematologic/Lymphatic Swollen glands?: No Easy bruising?: No  Cardiovascular Leg swelling?: No Chest pain?: No  Respiratory Cough?: No Shortness of breath?: No  Endocrine Excessive thirst?: No  Musculoskeletal Back pain?: No Joint pain?: No  Neurological Headaches?: No Dizziness?: No  Psychologic Depression?: No Anxiety?: No  Physical Exam: BP 115/73   Pulse 96   Resp 16   Wt 266 lb 11.2 oz (121 kg)   SpO2 96%   BMI 41.77 kg/m   Constitutional: Well nourished. Alert and oriented, No acute distress. HEENT: [place] AT, moist mucus membranes. Trachea midline, no masses. Cardiovascular: No clubbing, cyanosis, or edema. Respiratory: Normal respiratory effort, no increased work of breathing. GI: Abdomen is soft, non tender, non distended, no abdominal masses. Liver and spleen not palpable.  No hernias appreciated.  Stool sample for occult testing is not indicated.   GU: No CVA tenderness.  No bladder fullness or masses.  Patient with uncircumcised phallus.  Foreskin easily retracted  Urethral meatus is patent.  No penile discharge. No penile lesions or rashes. Scrotum without lesions, cysts, rashes and/or edema.  Testicles are located scrotally bilaterally. No masses are appreciated in the testicles. Left and right epididymis are normal. Rectal: Not performed.   Skin:  No rashes, bruises or suspicious lesions. Lymph: No cervical or inguinal adenopathy. Neurologic: Grossly intact, no focal deficits, moving all 4 extremities. Psychiatric: Normal mood and affect.   Laboratory Data: Lab Results  Component Value Date   WBC 7.2 05/14/2013   HGB 15.4 12/23/2014   HCT 42.0 05/14/2013   MCV 86 05/14/2013   PLT 167 05/14/2013    Lab Results  Component Value Date   CREATININE 0.85 12/21/2014    No results found for: PSA  No results found for: TESTOSTERONE  Lab Results  Component Value Date   HGBA1C 9.1 (H) 06/09/2012    No results found for: TSH     Component Value Date/Time   CHOL 155 06/09/2012 1202   HDL 34 (L) 06/09/2012 1202   VLDL 26 06/09/2012 1202   LDLCALC 95 06/09/2012 1202    Lab Results  Component Value Date   AST 41 (H) 05/14/2013   Lab Results  Component Value Date   ALT  30 05/14/2013   No components found for: ALKALINEPHOPHATASE No components found for: BILIRUBINTOTAL  No results found for: ESTRADIOL  Urinalysis    Component Value Date/Time   COLORURINE Straw 05/14/2013 1025   APPEARANCEUR Clear 05/14/2013 1025   LABSPEC 1.003 05/14/2013 1025   PHURINE 6.0 05/14/2013 1025   GLUCOSEU >=500 05/14/2013 1025   HGBUR Negative 05/14/2013 1025   BILIRUBINUR Negative 05/14/2013 1025   KETONESUR Negative 05/14/2013 1025   PROTEINUR Negative 05/14/2013 1025   NITRITE Negative 05/14/2013 1025   LEUKOCYTESUR Negative 05/14/2013 1025    I have reviewed the labs.  Assessment & Plan:    1. Balanitis Resolved  2. BPH with LUTS Continue conservative management, avoiding bladder irritants and timed voiding's PSA was 0.4 in 09/2017  3. Erectile dysfunction Sildenafil 20 mg, 3 to 5 tablets two hours prior to intercourse on an empty stomach, # 50; he is warned not to take medications that contain nitrates.  I also advised him of the side effects, such as: headache, flushing, dyspepsia, abnormal vision, nasal  congestion, back pain, myalgia, nausea, dizziness, and rash.  No follow-ups on file.  These notes generated with voice recognition software. I apologize for typographical errors.  Michiel CowboySHANNON Mikala Podoll, PA-C  [facility] [address]  [address] [place], [place] [address] [phone]

## 2017-10-03 ENCOUNTER — Ambulatory Visit (INDEPENDENT_AMBULATORY_CARE_PROVIDER_SITE_OTHER): Payer: Medicare Other | Admitting: Urology

## 2017-10-03 ENCOUNTER — Encounter: Payer: Self-pay | Admitting: Urology

## 2017-10-03 VITALS — BP 115/73 | HR 96 | Resp 16 | Wt 266.7 lb

## 2017-10-03 DIAGNOSIS — N138 Other obstructive and reflux uropathy: Secondary | ICD-10-CM | POA: Diagnosis not present

## 2017-10-03 DIAGNOSIS — N481 Balanitis: Secondary | ICD-10-CM

## 2017-10-03 DIAGNOSIS — N529 Male erectile dysfunction, unspecified: Secondary | ICD-10-CM | POA: Diagnosis not present

## 2017-10-03 DIAGNOSIS — N401 Enlarged prostate with lower urinary tract symptoms: Secondary | ICD-10-CM

## 2017-10-03 MED ORDER — SILDENAFIL CITRATE 20 MG PO TABS: ORAL_TABLET | ORAL | 3 refills | Status: DC

## 2018-01-24 ENCOUNTER — Ambulatory Visit: Payer: Medicare Other | Admitting: Cardiology

## 2018-01-24 ENCOUNTER — Encounter

## 2018-02-03 ENCOUNTER — Telehealth: Payer: Self-pay

## 2018-02-03 NOTE — Telephone Encounter (Signed)
Patient is calling to see if we can please move up his appointment He is needing to have Hip surgery and needs to have cardiac clearance prior   Please advise

## 2018-02-04 NOTE — Telephone Encounter (Signed)
I left a message for the patient to call back.  Holding a slot on 02/07/18 at 11:00 am with Dr. Kirke Corin.

## 2018-02-05 NOTE — Telephone Encounter (Signed)
Patient scheduled for 02/07/18 with Dr. Kirke Corin.

## 2018-02-07 ENCOUNTER — Encounter: Payer: Self-pay | Admitting: Cardiovascular Disease

## 2018-02-07 ENCOUNTER — Encounter

## 2018-02-07 ENCOUNTER — Ambulatory Visit: Payer: Medicare Other | Admitting: Cardiovascular Disease

## 2018-02-07 VITALS — BP 135/85 | HR 73 | Ht 68.0 in | Wt 273.2 lb

## 2018-02-07 DIAGNOSIS — I10 Essential (primary) hypertension: Secondary | ICD-10-CM

## 2018-02-07 DIAGNOSIS — R0602 Shortness of breath: Secondary | ICD-10-CM | POA: Diagnosis not present

## 2018-02-07 DIAGNOSIS — Z01818 Encounter for other preprocedural examination: Secondary | ICD-10-CM

## 2018-02-07 NOTE — Patient Instructions (Signed)
Medication Instructions:  No changes  If you need a refill on your cardiac medications before your next appointment, please call your pharmacy.   Lab work: None ordered  Testing/Procedures: Your physician has requested that you have a lexiscan myoview. For further information please visit [email or web address] Please follow instruction sheet, as given.  Follow-Up:  As needed with Dr. Kirke Corin.   Any Other Special Instructions Will Be Listed Below (If Applicable).  [facility] MYOVIEW  Your provider has ordered a Stress Test with nuclear imaging. The purpose of this test is to evaluate the blood supply to your heart muscle. This procedure is referred to as a "Non-Invasive Stress Test." This is because other than having an IV started in your vein, nothing is inserted or "invades" your body. Cardiac stress tests are done to find areas of poor blood flow to the heart by determining the extent of coronary artery disease (CAD). Some patients exercise on a treadmill, which naturally increases the blood flow to your heart, while others who are unable to walk on a treadmill due to physical limitations have a pharmacologic/chemical stress agent called Lexiscan . This medicine will mimic walking on a treadmill by temporarily increasing your coronary blood flow.   Please note: these test may take anywhere between 2-4 hours to complete  PLEASE REPORT TO [facility] MEDICAL MALL ENTRANCE  THE VOLUNTEERS AT THE FIRST DESK WILL DIRECT YOU WHERE TO GO  Date of Procedure:_____________________________________  Arrival Time for Procedure:______________________________  Instructions regarding medication:   __X__ : Hold diabetes medication morning of procedure   PLEASE NOTIFY THE OFFICE AT LEAST 24 HOURS IN ADVANCE IF YOU ARE UNABLE TO KEEP YOUR APPOINTMENT.  [phone] AND  PLEASE NOTIFY NUCLEAR MEDICINE AT [facility] AT LEAST 24 HOURS IN ADVANCE IF YOU ARE UNABLE TO KEEP YOUR APPOINTMENT. [phone]  How to prepare  for your Myoview test:  1. Do not eat or drink after midnight 2. No caffeine for 24 hours prior to test 3. No smoking 24 hours prior to test. 4. Your medication may be taken with water.  If your doctor stopped a medication because of this test, do not take that medication. 5. Ladies, please do not wear dresses.  Skirts or pants are appropriate. Please wear a short sleeve shirt. 6. No perfume, cologne or lotion. 7. Wear comfortable walking shoes. No heels!

## 2018-02-07 NOTE — Progress Notes (Signed)
Cardiology Office Note   Date:  02/07/2018   ID:  Lance [name], DOB [birth year], MRN [id number]  PCP:  Leanna Sato, MD  Cardiologist:   Lorine Bears, MD   Chief Complaint  Patient presents with  . other    Cardiac clearance hip surgery. Meds reviewed verbally with pt.      History of Present Illness: Lance [name] is a 62 y.o. male who was referred by Dr. Marvis Moeller for preoperative cardiovascular evaluation before hip surgery.  The patient has no prior cardiac history.  He did have previous cardiac catheterization in 2004 which showed mild nonobstructive coronary artery disease with normal ejection fraction.  He has chronic medical conditions that include type 2 diabetes diagnosed when he was in his 30s, hypertension, hyperlipidemia not on statins due to myalgia and obesity.  He is not a smoker but does have family history of coronary artery disease.  His father died in his early 63s after myocardial infarction.  He was a heavy drinker. The patient fell about 4 years ago and injured his hip and his mobility has been limited since then.  He denies any chest pain but he does have exertional dyspnea and is not able to do much activities because of his hip situation.  He is not very active.  No orthopnea, PND or leg edema.    Past Medical History:  Diagnosis Date  . Anxiety   . Arthritis    knees  . Complication of anesthesia 2009   during shoulder block-ht rate dropped-had injection-did do surgery-  . Depression   . Diabetes mellitus   . Hyperlipidemia   . Hypertension   . Trochanteric bursitis of right hip     Past Surgical History:  Procedure Laterality Date  . CARDIAC CATHETERIZATION  2004   normal  . EXCISION/RELEASE BURSA HIP Right 12/23/2014   Procedure: RIGHT RESECTION GREATER TROCHANTERIC BURSA;  Surgeon: Loreta Ave, MD;  Location: [facility];  Service: Orthopedics;  Laterality: Right;  . ILIOTIBIAL BAND RELEASE Right 12/23/2014   Procedure:  RIGHT HIP ILIO-TIBIAL BAND RELEASE;  Surgeon: Loreta Ave, MD;  Location: [facility];  Service: Orthopedics;  Laterality: Right;  . KNEE ARTHROSCOPY  2006   lt  . KNEE ARTHROSCOPY  03/08/2011   Procedure: ARTHROSCOPY KNEE;  Surgeon: Loreta Ave, MD;  Location: [facility];  Service: Orthopedics;  Laterality: Left;  left knee arthroscopy  anterior horn  medial meniscus cyst  . SHOULDER ARTHROSCOPY  2009   lt  . SHOULDER ARTHROSCOPY  2006   right  . TONSILLECTOMY       Current Outpatient Medications  Medication Sig Dispense Refill  . amLODipine (NORVASC) 5 MG tablet Take 5 mg by mouth daily.      Marland Kitchen linagliptin (TRADJENTA) 5 MG TABS tablet Take 5 mg by mouth daily.    Marland Kitchen lisinopril-hydrochlorothiazide (PRINZIDE,ZESTORETIC) 20-12.5 MG per tablet Take 1 tablet by mouth daily.      . metFORMIN (GLUCOPHAGE-XR) 500 MG 24 hr tablet Take 1,000 mg by mouth 2 (two) times daily.     . Multiple Minerals (CALCIUM/MAGNESIUM/ZINC PO) Take by mouth daily.    . pioglitazone (ACTOS) 30 MG tablet Take 30 mg by mouth daily.     No current facility-administered medications for this visit.     Allergies:   Shrimp [shellfish allergy]    Social History:  The patient  reports that he has never smoked. He has never  used smokeless tobacco. He reports that he does not drink alcohol or use drugs.   Family History:  The patient's family history includes Heart attack in his father.    ROS:  Please see the history of present illness.   Otherwise, review of systems are positive for none.   All other systems are reviewed and negative.    PHYSICAL EXAM: VS:  BP 135/85 (BP Location: Right Arm, Patient Position: Sitting, Cuff Size: Large)   Pulse 73   Ht 5\' 8"  (1.727 m)   Wt 273 lb 4 oz (123.9 kg)   BMI 41.55 kg/m  , BMI Body mass index is 41.55 kg/m. GEN: Well nourished, well developed, in no acute distress  HEENT: normal  Neck: no JVD, carotid bruits, or masses Cardiac:  RRR; no murmurs, rubs, or gallops,no edema  Respiratory:  clear to auscultation bilaterally, normal work of breathing GI: soft, nontender, nondistended, + BS MS: no deformity or atrophy  Skin: warm and dry, no rash Neuro:  Strength and sensation are intact Psych: euthymic mood, full affect   EKG:  EKG is ordered today. The ekg ordered today demonstrates normal sinus rhythm with left axis deviation and no significant ST or T wave changes.   Recent Labs: No results found for requested labs within last 8760 hours.    Lipid Panel    Component Value Date/Time   CHOL 155 06/09/2012 1202   TRIG 128 06/09/2012 1202   HDL 34 (L) 06/09/2012 1202   VLDL 26 06/09/2012 1202   LDLCALC 95 06/09/2012 1202      Wt Readings from Last 3 Encounters:  02/07/18 273 lb 4 oz (123.9 kg)  10/03/17 266 lb 11.2 oz (121 kg)  09/17/17 264 lb 8 oz (120 kg)      PAD Screen 02/07/2018  Previous PAD dx? No  Previous surgical procedure? No  Pain with walking? No  Feet/toe relief with dangling? No  Painful, non-healing ulcers? No  Extremities discolored? No      ASSESSMENT AND PLAN:  1.  Preop cardiovascular evaluation for hip surgery: The patient has multiple uncontrolled risk factors for coronary artery disease with poor functional capacity.  Due to that, I recommend evaluation with Lexiscan Myoview.  He is not able to exercise on a treadmill.  2.  Essential hypertension: Blood pressures controlled on current medications.    Disposition:   FU with me as needed.   Signed,  Lorine Bears, MD  02/07/2018 11:18 AM     [facility]

## 2018-02-10 ENCOUNTER — Ambulatory Visit: Payer: Self-pay | Admitting: Cardiovascular Disease

## 2018-02-11 ENCOUNTER — Ambulatory Visit
Admission: RE | Admit: 2018-02-11 | Discharge: 2018-02-11 | Disposition: A | Discharge status: Home / Self Care | Payer: Medicare Other | Source: Ambulatory Visit | Attending: Cardiovascular Disease | Admitting: Cardiovascular Disease

## 2018-02-11 DIAGNOSIS — Z6841 Body Mass Index (BMI) 40.0 and over, adult: Secondary | ICD-10-CM | POA: Diagnosis not present

## 2018-02-11 DIAGNOSIS — Z01810 Encounter for preprocedural cardiovascular examination: Secondary | ICD-10-CM | POA: Diagnosis not present

## 2018-02-11 DIAGNOSIS — Z01818 Encounter for other preprocedural examination: Secondary | ICD-10-CM | POA: Diagnosis not present

## 2018-02-11 DIAGNOSIS — R0602 Shortness of breath: Secondary | ICD-10-CM | POA: Diagnosis not present

## 2018-02-11 LAB — NM MYOCAR MULTI W/SPECT W/WALL MOTION / EF
Estimated workload: 1 METS
Exercise duration (min): 0 min
Exercise duration (sec): 0 s
LV dias vol: 102 mL (ref 62–150)
LV sys vol: 38 mL
MPHR: 161 {beats}/min
Peak HR: 108 {beats}/min
Percent HR: 67 %
Rest HR: 75 {beats}/min
TID: 1.11

## 2018-02-11 MED ORDER — TECHNETIUM TC 99M TETROFOSMIN IV KIT
29.3600 | PACK | Freq: Once | INTRAVENOUS | Status: AC | PRN
  Administered 2018-02-11: 29.36 via INTRAVENOUS

## 2018-02-11 MED ORDER — REGADENOSON 0.4 MG/5ML IV SOLN
0.4000 mg | Freq: Once | INTRAVENOUS | Status: AC
  Administered 2018-02-11: 0.4 mg via INTRAVENOUS

## 2018-02-11 MED ORDER — TECHNETIUM TC 99M TETROFOSMIN IV KIT
10.8800 | PACK | Freq: Once | INTRAVENOUS | Status: AC | PRN
  Administered 2018-02-11: 10.88 via INTRAVENOUS

## 2018-02-13 ENCOUNTER — Telehealth: Payer: Self-pay | Admitting: *Deleted

## 2018-02-13 NOTE — Telephone Encounter (Signed)
Spoke with the nurse from [employer]. She is aware of the cardiac clearance via epic. Nothing further needed at this time.

## 2018-02-13 NOTE — Telephone Encounter (Signed)
Patient made aware of results and verbalized understanding.  Notes recorded by Iran Ouch, MD on 02/12/2018 at 9:53 AM EST Inform patient that stress test was normal. He is at low risk for hip surgery.  Call placed to Delbert Harness at [phone]. Message left to call back

## 2018-03-10 ENCOUNTER — Ambulatory Visit: Payer: Medicare Other | Admitting: Cardiology

## 2019-02-12 ENCOUNTER — Other Ambulatory Visit: Payer: Self-pay

## 2019-02-12 ENCOUNTER — Emergency Department
Admission: EM | Admit: 2019-02-12 | Discharge: 2019-02-12 | Disposition: A | Discharge status: Home / Self Care | Payer: Medicare Other | Attending: Emergency Medicine | Admitting: Emergency Medicine

## 2019-02-12 DIAGNOSIS — I10 Essential (primary) hypertension: Secondary | ICD-10-CM | POA: Diagnosis not present

## 2019-02-12 DIAGNOSIS — L02212 Cutaneous abscess of back [any part, except buttock]: Secondary | ICD-10-CM | POA: Diagnosis not present

## 2019-02-12 DIAGNOSIS — Z79899 Other long term (current) drug therapy: Secondary | ICD-10-CM | POA: Insufficient documentation

## 2019-02-12 DIAGNOSIS — E119 Type 2 diabetes mellitus without complications: Secondary | ICD-10-CM | POA: Diagnosis not present

## 2019-02-12 DIAGNOSIS — Z7984 Long term (current) use of oral hypoglycemic drugs: Secondary | ICD-10-CM | POA: Insufficient documentation

## 2019-02-12 DIAGNOSIS — R222 Localized swelling, mass and lump, trunk: Secondary | ICD-10-CM | POA: Diagnosis present

## 2019-02-12 DIAGNOSIS — L0291 Cutaneous abscess, unspecified: Secondary | ICD-10-CM

## 2019-02-12 MED ORDER — SULFAMETHOXAZOLE-TRIMETHOPRIM 800-160 MG PO TABS: 1.0000 | ORAL_TABLET | Freq: Two times a day (BID) | ORAL | 0 refills | Status: AC

## 2019-02-12 MED ORDER — LIDOCAINE HCL (PF) 1 % IJ SOLN
INTRAMUSCULAR | Status: AC
  Administered 2019-02-12: 5 mL
  Filled 2019-02-12: qty 5

## 2019-02-12 MED ORDER — LIDOCAINE HCL 1 % IJ SOLN
5.0000 mL | Freq: Once | INTRAMUSCULAR | Status: AC
  Administered 2019-02-12: 19:00:00 5 mL

## 2019-02-12 NOTE — ED Triage Notes (Signed)
Pt states he has a spider bite to back over 6 weeks ago. Pt was seen at [facility] and has been taking oral antibiotics for n days. This RN notes large, half dollar diameter red swollen area mid back. Sore is red, raised and tender. No drainage noted at present, pt states he has green drainage.

## 2019-02-12 NOTE — Discharge Instructions (Signed)
Take Bactrim twice daily for the next week. Have packing removed in 2 days.

## 2019-02-12 NOTE — ED Provider Notes (Signed)
[facility] Emergency Department Provider Note  ____________________________________________  Time seen: Approximately 6:51 PM  I have reviewed the triage vital signs and the nursing notes.   HISTORY  Chief Complaint Insect Bite    HPI Lance [name] is a 63 y.o. male presents to the emergency with a back abscess that has been present for the past 6 weeks.  Patient [facility] been [facility] and [facility] and has been taking Levaquin.  Patient reports that his symptoms have not been improving.  He has noticed a scant amount of purulent drainage and skin around his back.  No fever or chills at home.  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  Patient feels like he was bitten by a spider.        Past Medical History:  Diagnosis Date  . Anxiety   . Arthritis    knees  . Complication of anesthesia 2009   during shoulder block-ht rate dropped-had injection-did do surgery-  . Depression   . Diabetes mellitus   . Hyperlipidemia   . Hypertension   . Trochanteric bursitis of right hip     Patient Active Problem List   Diagnosis Date Noted  . HTN (hypertension) 10/02/2017  . Personal history of kidney stones 10/02/2017    Past Surgical History:  Procedure Laterality Date  . CARDIAC CATHETERIZATION  2004   normal  . EXCISION/RELEASE BURSA HIP Right 12/23/2014   Procedure: RIGHT RESECTION GREATER TROCHANTERIC BURSA;  Surgeon: Loreta Aveaniel F Murphy, MD;  Location: [facility];  Service: Orthopedics;  Laterality: Right;  . ILIOTIBIAL BAND RELEASE Right 12/23/2014   Procedure: RIGHT HIP ILIO-TIBIAL BAND RELEASE;  Surgeon: Loreta Aveaniel F Murphy, MD;  Location: [facility];  Service: Orthopedics;  Laterality: Right;  . KNEE ARTHROSCOPY  2006   lt  . KNEE ARTHROSCOPY  03/08/2011   Procedure: ARTHROSCOPY KNEE;  Surgeon: Loreta Aveaniel F Murphy, MD;  Location:  SURGERY CENTER;  Service: Orthopedics;  Laterality: Left;   left knee arthroscopy  anterior horn  medial meniscus cyst  . SHOULDER ARTHROSCOPY  2009   lt  . SHOULDER ARTHROSCOPY  2006   right  . TONSILLECTOMY      Prior to Admission medications   Medication Sig Start Date End Date Taking? Authorizing Provider  amLODipine (NORVASC) 5 MG tablet Take 5 mg by mouth daily.      [provider]  linagliptin (TRADJENTA) 5 MG TABS tablet Take 5 mg by mouth daily.    [provider]  lisinopril-hydrochlorothiazide (PRINZIDE,ZESTORETIC) 20-12.5 MG per tablet Take 1 tablet by mouth daily.      [provider]  metFORMIN (GLUCOPHAGE-XR) 500 MG 24 hr tablet Take 1,000 mg by mouth 2 (two) times daily.  07/30/17   [provider]  Multiple Minerals (CALCIUM/MAGNESIUM/ZINC PO) Take by mouth daily.    [provider]  pioglitazone (ACTOS) 30 MG tablet Take 30 mg by mouth daily.    [provider]  sulfamethoxazole-trimethoprim (BACTRIM DS) 800-160 MG tablet Take 1 tablet by mouth 2 (two) times daily for 7 days. 02/12/19 02/19/19  Orvil FeilWoods, Anara Cowman M, PA-C    Allergies Shrimp [shellfish allergy]  Family History  Problem Relation Age of Onset  . Heart attack Father     Social History Social History   Tobacco Use  . Smoking status: Never Smoker  . Smokeless tobacco: Never Used  Substance Use Topics  . Alcohol use: No  . Drug use:  No     Review of Systems  Constitutional: No fever/chills Eyes: No visual changes. No discharge ENT: No upper respiratory complaints. Cardiovascular: no chest pain. Respiratory: no cough. No SOB. Gastrointestinal: No abdominal pain.  No nausea, no vomiting.  No diarrhea.  No constipation. Musculoskeletal: Negative for musculoskeletal pain. Skin: Patient has abscess.  Neurological: Negative for headaches, focal weakness or numbness.  ____________________________________________   PHYSICAL EXAM:  VITAL SIGNS: ED Triage Vitals  Enc Vitals Group     BP 02/12/19 1820  (!) 129/95     Pulse Rate 02/12/19 1815 100     Resp 02/12/19 1815 18     Temp 02/12/19 1815 98.1 F (36.7 C)     Temp Source 02/12/19 1815 Oral     SpO2 02/12/19 1815 98 %     Weight 02/12/19 1818 280 lb (127 kg)     Height 02/12/19 1818 5\' 7"  (1.702 m)     Head Circumference --      Peak Flow --      Pain Score 02/12/19 1817 4     Pain Loc --      Pain Edu? --      Excl. in GC? --      Constitutional: Alert and oriented. Well appearing and in no acute distress. Eyes: Conjunctivae are normal. PERRL. EOMI. Head: Atraumatic. ENT: Cardiovascular: Normal rate, regular rhythm. Normal S1 and S2.  Good peripheral circulation. Respiratory: Normal respiratory effort without tachypnea or retractions. Lungs CTAB. Good air entry to the bases with no decreased or absent breath sounds. Gastrointestinal: Bowel sounds 4 quadrants. Soft and nontender to palpation. No guarding or rigidity. No palpable masses. No distention. No CVA tenderness. Musculoskeletal: Full range of motion to all extremities. No gross deformities appreciated. Neurologic:  Normal speech and language. No gross focal neurologic deficits are appreciated.  Skin: Patient has a 3 cm x 3 cm abscess along right upper back.  There is associated induration and fluctuance. Psychiatric: Mood and affect are normal. Speech and behavior are normal. Patient exhibits appropriate insight and judgement.   ____________________________________________   LABS (all labs ordered are listed, but only abnormal results are displayed)  Labs Reviewed - No data to display ____________________________________________  EKG   ____________________________________________  RADIOLOGY   No results found.  ____________________________________________    PROCEDURES  Procedure(s) performed:    13/05/20Marland KitchenIncision and Drainage  Date/Time: 02/12/2019 6:53 PM Performed by: 13/08/2018, PA-C Authorized by: Orvil Feil, PA-C   Consent:     Consent obtained:  Verbal   Consent given by:  Patient   Risks discussed:  Bleeding, incomplete drainage, pain and damage to other organs   Alternatives discussed:  No treatment Universal protocol:    Procedure explained and questions answered to patient or proxy's satisfaction: yes     Relevant documents present and verified: yes     Test results available and properly labeled: yes     Imaging studies available: yes     Required blood products, implants, devices, and special equipment available: yes     Site/side marked: yes     Immediately prior to procedure a time out was called: yes     Patient identity confirmed:  Verbally with patient Location:    Type:  Abscess   Location:  Trunk   Trunk location:  Back Pre-procedure details:    Skin preparation:  Betadine Anesthesia (see MAR for exact dosages):    Anesthesia method:  Local infiltration   Local anesthetic:  Lidocaine 1% WITH epi Procedure type:    Complexity:  Simple Procedure details:    Incision types:  Single straight   Incision depth:  Subcutaneous   Scalpel blade:  11   Wound management:  Probed and deloculated, irrigated with saline and extensive cleaning   Drainage:  Purulent   Drainage amount:  Moderate   Packing materials:  1/4 in gauze Post-procedure details:    Patient tolerance of procedure:  Tolerated well, no immediate complications      Medications  lidocaine (XYLOCAINE) 1 % (with pres) injection 5 mL (has no administration in time range)     ____________________________________________   INITIAL IMPRESSION / ASSESSMENT AND PLAN / ED COURSE  Pertinent labs & imaging results that were available during my care of the patient were reviewed by me and considered in my medical decision making (see chart for details).  Review of the [place] CSRS was performed in accordance of the [place] prior to dispensing any controlled drugs.           Assessment and plan Abscess 63 year old male presents to the  emergency department with a abscess of the right upper back that has been present for the past 3 weeks.  Underwent incision and drainage in the emergency department without complication and patient was advised to have packing removed in 2 days.  He was transitioned from Levaquin to Bactrim to provide better MRSA coverage.  Return precautions were given to return to the emergency department with new or worsening symptoms.  He voiced understanding and has easy access to the emergency department should symptoms worsen.  ____________________________________________  FINAL CLINICAL IMPRESSION(S) / ED DIAGNOSES  Final diagnoses:  Abscess      NEW MEDICATIONS STARTED DURING THIS VISIT:  ED Discharge Orders         Ordered    sulfamethoxazole-trimethoprim (BACTRIM DS) 800-160 MG tablet  2 times daily     02/12/19 1847              This chart was dictated using voice recognition software/Dragon. Despite best efforts to proofread, errors can occur which can change the meaning. Any change was purely unintentional.    Lannie Fields, PA-C 02/12/19 1854    Duffy Bruce, MD 02/13/19 1521

## 2019-02-17 ENCOUNTER — Other Ambulatory Visit: Payer: Self-pay | Admitting: Family Medicine

## 2019-02-17 DIAGNOSIS — M79661 Pain in right lower leg: Secondary | ICD-10-CM

## 2019-02-19 ENCOUNTER — Other Ambulatory Visit: Payer: Self-pay

## 2019-02-19 ENCOUNTER — Ambulatory Visit
Admission: RE | Admit: 2019-02-19 | Discharge: 2019-02-19 | Disposition: A | Discharge status: Home / Self Care | Payer: Medicare Other | Source: Ambulatory Visit | Attending: Family Medicine | Admitting: Family Medicine

## 2019-02-19 DIAGNOSIS — M79661 Pain in right lower leg: Secondary | ICD-10-CM | POA: Diagnosis present

## 2019-11-11 ENCOUNTER — Ambulatory Visit: Admit: 2019-11-11 | Discharge: 2019-11-13 | Disposition: A | Discharge status: Home / Self Care | Payer: MEDICARE

## 2019-11-12 DIAGNOSIS — I214 Non-ST elevation (NSTEMI) myocardial infarction: Secondary | ICD-10-CM | POA: Insufficient documentation

## 2019-11-12 MED ORDER — PRAVASTATIN 40 MG TABLET
ORAL_TABLET | Freq: Every day | ORAL | 0 refills | 30 days
Start: 2019-11-12 — End: 2019-11-12

## 2019-11-12 MED ORDER — PRASUGREL 10 MG TABLET
ORAL_TABLET | Freq: Every day | ORAL | 0 refills | 30 days
Start: 2019-11-12 — End: 2019-11-12

## 2019-11-12 MED ORDER — JARDIANCE 10 MG TABLET
ORAL_TABLET | Freq: Every day | ORAL | 0 refills | 30 days
Start: 2019-11-12 — End: 2019-11-12

## 2019-11-12 MED ORDER — EZETIMIBE 10 MG TABLET
ORAL_TABLET | Freq: Every day | ORAL | 0 refills | 30.00000 days
Start: 2019-11-12 — End: 2019-11-12

## 2019-11-12 MED ORDER — REPATHA SURECLICK 140 MG/ML SUBCUTANEOUS PEN INJECTOR
SUBCUTANEOUS | 0 refills | 28 days
Start: 2019-11-12 — End: 2019-11-12

## 2019-11-12 MED ORDER — TICAGRELOR 90 MG TABLET
ORAL_TABLET | Freq: Two times a day (BID) | ORAL | 0 refills | 30.00000 days
Start: 2019-11-12 — End: 2019-11-12

## 2019-11-13 MED ORDER — NITROGLYCERIN 0.4 MG SUBLINGUAL TABLET
ORAL_TABLET | SUBLINGUAL | 0 refills | 1.00000 days | Status: CP | PRN
Start: 2019-11-13 — End: 2020-11-12

## 2019-11-13 MED ORDER — LANTUS SOLOSTAR U-100 INSULIN 100 UNIT/ML (3 ML) SUBCUTANEOUS PEN
Freq: Every evening | SUBCUTANEOUS | 0 refills | 30 days | Status: CP
Start: 2019-11-13 — End: 2019-12-13

## 2019-11-13 MED ORDER — BLOOD GLUCOSE TEST STRIPS
ORAL_STRIP | 11 refills | 0 days | Status: CP
Start: 2019-11-13 — End: 2020-11-12

## 2019-11-13 MED ORDER — BLOOD-GLUCOSE METER KIT WRAPPER
11 refills | 0 days | Status: CP
Start: 2019-11-13 — End: 2020-11-12

## 2019-11-13 MED ORDER — PEN NEEDLE, DIABETIC 31 GAUGE X 3/16" (5 MM)
0 refills | 0 days | Status: CP
Start: 2019-11-13 — End: ?

## 2019-11-13 MED ORDER — LANCETS
11 refills | 0 days | Status: CP
Start: 2019-11-13 — End: 2020-11-12

## 2019-11-14 MED ORDER — PRASUGREL 10 MG TABLET
ORAL_TABLET | Freq: Every day | ORAL | 0 refills | 30 days | Status: CP
Start: 2019-11-14 — End: 2019-12-14

## 2019-11-14 MED ORDER — ASPIRIN 81 MG CHEWABLE TABLET
ORAL_TABLET | Freq: Every day | ORAL | 0 refills | 30 days | Status: CP
Start: 2019-11-14 — End: 2019-12-14

## 2019-11-14 MED ORDER — LISINOPRIL 40 MG TABLET
ORAL_TABLET | Freq: Every day | ORAL | 0 refills | 30 days | Status: CP
Start: 2019-11-14 — End: 2019-12-14

## 2019-11-14 MED ORDER — PRAVASTATIN 40 MG TABLET
ORAL_TABLET | Freq: Every day | ORAL | 0 refills | 30.00000 days | Status: CP
Start: 2019-11-14 — End: 2019-12-14

## 2019-11-14 MED ORDER — METOPROLOL SUCCINATE ER 50 MG TABLET,EXTENDED RELEASE 24 HR
ORAL_TABLET | Freq: Every day | ORAL | 0 refills | 30 days | Status: CP
Start: 2019-11-14 — End: 2019-12-14

## 2019-12-04 ENCOUNTER — Encounter: Payer: Self-pay | Admitting: *Deleted

## 2019-12-04 ENCOUNTER — Encounter: Payer: Medicare Other | Attending: Family Medicine | Admitting: *Deleted

## 2019-12-04 ENCOUNTER — Other Ambulatory Visit: Payer: Self-pay

## 2019-12-04 DIAGNOSIS — Z955 Presence of coronary angioplasty implant and graft: Secondary | ICD-10-CM

## 2019-12-04 DIAGNOSIS — I214 Non-ST elevation (NSTEMI) myocardial infarction: Secondary | ICD-10-CM

## 2019-12-04 NOTE — Progress Notes (Signed)
Initial orientation completed. Diagnosis can be found in [facility] 8/4. EP orientation scheduled for 9/2 at 2:30

## 2019-12-07 ENCOUNTER — Encounter: Admit: 2019-12-07 | Discharge: 2019-12-08 | Payer: MEDICARE

## 2019-12-07 DIAGNOSIS — I214 Non-ST elevation (NSTEMI) myocardial infarction: Principal | ICD-10-CM

## 2019-12-07 MED ORDER — METOPROLOL SUCCINATE ER 50 MG TABLET,EXTENDED RELEASE 24 HR
ORAL_TABLET | Freq: Every day | ORAL | 5 refills | 30.00000 days | Status: CP
Start: 2019-12-07 — End: 2020-06-04

## 2019-12-07 MED ORDER — ASPIRIN 81 MG CHEWABLE TABLET: 81 mg | tablet | Freq: Every day | 5 refills | 30 days | Status: AC

## 2019-12-07 MED ORDER — PRAVASTATIN 40 MG TABLET: 40 mg | tablet | Freq: Every day | 5 refills | 30 days | Status: AC

## 2019-12-07 MED ORDER — LISINOPRIL 40 MG TABLET
ORAL_TABLET | Freq: Every day | ORAL | 0 refills | 30.00000 days | Status: CP
Start: 2019-12-07 — End: 2019-12-07

## 2019-12-07 MED ORDER — PRASUGREL 10 MG TABLET: 10 mg | tablet | Freq: Every day | 0 refills | 30 days | Status: AC

## 2019-12-07 MED ORDER — PRASUGREL 10 MG TABLET: 10 mg | tablet | Freq: Every day | 5 refills | 30 days | Status: AC

## 2019-12-07 MED ORDER — ASPIRIN 81 MG CHEWABLE TABLET
ORAL_TABLET | Freq: Every day | ORAL | 5 refills | 30.00000 days | Status: CP
Start: 2019-12-07 — End: 2019-12-07

## 2019-12-07 MED ORDER — LISINOPRIL 40 MG TABLET: 40 mg | tablet | Freq: Every day | 0 refills | 30 days | Status: AC

## 2019-12-07 MED ORDER — PRASUGREL 10 MG TABLET
ORAL_TABLET | Freq: Every day | ORAL | 5 refills | 30.00000 days | Status: CP
Start: 2019-12-07 — End: 2020-06-04

## 2019-12-07 MED ORDER — PRAVASTATIN 40 MG TABLET
ORAL_TABLET | Freq: Every day | ORAL | 0 refills | 30.00000 days | Status: CP
Start: 2019-12-07 — End: 2019-12-07

## 2019-12-07 MED ORDER — METOPROLOL SUCCINATE ER 50 MG TABLET,EXTENDED RELEASE 24 HR: 50 mg | tablet | Freq: Every day | 0 refills | 30 days | Status: AC

## 2019-12-07 MED ORDER — AMLODIPINE 10 MG TABLET
ORAL_TABLET | Freq: Every day | ORAL | 5 refills | 30.00000 days | Status: CP
Start: 2019-12-07 — End: 2020-01-06

## 2019-12-07 MED ORDER — METOPROLOL SUCCINATE ER 50 MG TABLET,EXTENDED RELEASE 24 HR: 50 mg | tablet | Freq: Every day | 5 refills | 30 days | Status: AC

## 2019-12-07 MED ORDER — AMLODIPINE 10 MG TABLET: 10 mg | tablet | Freq: Every day | 5 refills | 30 days | Status: AC

## 2019-12-07 MED ORDER — LISINOPRIL 40 MG TABLET: 40 mg | tablet | Freq: Every day | 5 refills | 30 days | Status: AC

## 2019-12-07 MED ORDER — ASPIRIN 81 MG CHEWABLE TABLET: 81 mg | tablet | Freq: Every day | 0 refills | 30 days | Status: AC

## 2019-12-10 ENCOUNTER — Encounter: Payer: Medicare Other | Attending: Family Medicine

## 2019-12-10 ENCOUNTER — Other Ambulatory Visit: Payer: Self-pay

## 2019-12-10 VITALS — Ht 68.8 in | Wt 263.4 lb

## 2019-12-10 DIAGNOSIS — Z7982 Long term (current) use of aspirin: Secondary | ICD-10-CM | POA: Diagnosis not present

## 2019-12-10 DIAGNOSIS — Z7984 Long term (current) use of oral hypoglycemic drugs: Secondary | ICD-10-CM | POA: Diagnosis not present

## 2019-12-10 DIAGNOSIS — I214 Non-ST elevation (NSTEMI) myocardial infarction: Secondary | ICD-10-CM | POA: Diagnosis not present

## 2019-12-10 DIAGNOSIS — I10 Essential (primary) hypertension: Secondary | ICD-10-CM | POA: Insufficient documentation

## 2019-12-10 DIAGNOSIS — Z955 Presence of coronary angioplasty implant and graft: Secondary | ICD-10-CM

## 2019-12-10 DIAGNOSIS — Z79899 Other long term (current) drug therapy: Secondary | ICD-10-CM | POA: Diagnosis not present

## 2019-12-10 DIAGNOSIS — E119 Type 2 diabetes mellitus without complications: Secondary | ICD-10-CM | POA: Insufficient documentation

## 2019-12-10 DIAGNOSIS — E785 Hyperlipidemia, unspecified: Secondary | ICD-10-CM | POA: Insufficient documentation

## 2019-12-10 NOTE — Patient Instructions (Signed)
Patient Instructions  Patient Details  Name: Lance [name] MRN: [id number] Date of Birth: 30-Mar-1956 Referring Provider:  Leanna Sato, MD  Below are your personal goals for exercise, nutrition, and risk factors. Our goal is to help you stay on track towards obtaining and maintaining these goals. We will be discussing your progress on these goals with you throughout the program.  Initial Exercise Prescription:  Initial Exercise Prescription - 12/10/19 1600      Date of Initial Exercise RX and Referring Provider   Date 12/10/19    Referring Provider Darreld Mclean MD      Treadmill   MPH 1.9    Grade 0.5    Minutes 15    METs 2.59      NuStep   Level 2    SPM 80    Minutes 15    METs 2.4      REL-XR   Level 2    Speed 50    Minutes 15    METs 2.4      Biostep-RELP   Level 2    SPM 50    Minutes 15    METs 2.4      Prescription Details   Frequency (times per week) 3    Duration Progress to 30 minutes of continuous aerobic without signs/symptoms of physical distress      Intensity   THRR 40-80% of Max Heartrate 108-142    Ratings of Perceived Exertion 11-13    Perceived Dyspnea 0-4      Progression   Progression Continue to progress workloads to maintain intensity without signs/symptoms of physical distress.      Resistance Training   Training Prescription Yes    Weight 3    Reps 10-15           Exercise Goals: Frequency: Be able to perform aerobic exercise two to three times per week in program working toward 2-5 days per week of home exercise.  Intensity: Work with a perceived exertion of 11 (fairly light) - 15 (hard) while following your exercise prescription.  We will make changes to your prescription with you as you progress through the program.   Duration: Be able to do 30 to 45 minutes of continuous aerobic exercise in addition to a 5 minute warm-up and a 5 minute cool-down routine.   Nutrition Goals: Your personal nutrition goals will be  established when you do your nutrition analysis with the dietician.  The following are general nutrition guidelines to follow: Cholesterol < 200mg /day Sodium < 1500mg /day Fiber: Men over 50 yrs - 30 grams per day  Personal Goals:  Personal Goals and Risk Factors at Admission - 12/10/19 1616      Core Components/Risk Factors/Patient Goals on Admission    Weight Management Yes;Weight Loss    Intervention Weight Management: Develop a combined nutrition and exercise program designed to reach desired caloric intake, while maintaining appropriate intake of nutrient and fiber, sodium and fats, and appropriate energy expenditure required for the weight goal.;Weight Management: Provide education and appropriate resources to help participant work on and attain dietary goals.;Weight Management/Obesity: Establish reasonable short term and long term weight goals.    Admit Weight 263 lb 6.4 oz (119.5 kg)    Goal Weight: Short Term 258 lb (117 kg)    Goal Weight: Long Term 253 lb (114.8 kg)    Expected Outcomes Short Term: Continue to assess and modify interventions until short term weight is achieved;Long Term: Adherence to nutrition and physical  activity/exercise program aimed toward attainment of established weight goal;Weight Loss: Understanding of general recommendations for a balanced deficit meal plan, which promotes 1-2 lb weight loss per week and includes a negative energy balance of [phone] kcal/d;Understanding recommendations for meals to include 15-35% energy as protein, 25-35% energy from fat, 35-60% energy from carbohydrates, less than 200mg  of dietary cholesterol, 20-35 gm of total fiber daily;Understanding of distribution of calorie intake throughout the day with the consumption of 4-5 meals/snacks    Diabetes Yes    Intervention Provide education about signs/symptoms and action to take for hypo/hyperglycemia.;Provide education about proper nutrition, including hydration, and aerobic/resistive  exercise prescription along with prescribed medications to achieve blood glucose in normal ranges: Fasting glucose 65-99 mg/dL    Expected Outcomes Short Term: Participant verbalizes understanding of the signs/symptoms and immediate care of hyper/hypoglycemia, proper foot care and importance of medication, aerobic/resistive exercise and nutrition plan for blood glucose control.;Long Term: Attainment of HbA1C < 7%.    Hypertension Yes    Intervention Provide education on lifestyle modifcations including regular physical activity/exercise, weight management, moderate sodium restriction and increased consumption of fresh fruit, vegetables, and low fat dairy, alcohol moderation, and smoking cessation.;Monitor prescription use compliance.    Expected Outcomes Short Term: Continued assessment and intervention until BP is < 140/17mm HG in hypertensive participants. < 130/74mm HG in hypertensive participants with diabetes, heart failure or chronic kidney disease.;Long Term: Maintenance of blood pressure at goal levels.    Lipids Yes    Intervention Provide education and support for participant on nutrition & aerobic/resistive exercise along with prescribed medications to achieve LDL 70mg , HDL >40mg .    Expected Outcomes Short Term: Participant states understanding of desired cholesterol values and is compliant with medications prescribed. Participant is following exercise prescription and nutrition guidelines.;Long Term: Cholesterol controlled with medications as prescribed, with individualized exercise RX and with personalized nutrition plan. Value goals: LDL < 70mg , HDL > 40 mg.           Tobacco Use Initial Evaluation: Social History   Tobacco Use  Smoking Status Never Smoker  Smokeless Tobacco Never Used    Exercise Goals and Review:  Exercise Goals    Row Name 12/10/19 1613             Exercise Goals   Increase Physical Activity Yes       Intervention Provide advice, education, support  and counseling about physical activity/exercise needs.;Develop an individualized exercise prescription for aerobic and resistive training based on initial evaluation findings, risk stratification, comorbidities and participant's personal goals.       Expected Outcomes Short Term: Attend rehab on a regular basis to increase amount of physical activity.;Long Term: Add in home exercise to make exercise part of routine and to increase amount of physical activity.;Long Term: Exercising regularly at least 3-5 days a week.       Increase Strength and Stamina Yes       Intervention Provide advice, education, support and counseling about physical activity/exercise needs.;Develop an individualized exercise prescription for aerobic and resistive training based on initial evaluation findings, risk stratification, comorbidities and participant's personal goals.       Expected Outcomes Short Term: Increase workloads from initial exercise prescription for resistance, speed, and METs.;Short Term: Perform resistance training exercises routinely during rehab and add in resistance training at home;Long Term: Improve cardiorespiratory fitness, muscular endurance and strength as measured by increased METs and functional capacity ( )       Able to understand and use  rate of perceived exertion (RPE) scale Yes       Intervention Provide education and explanation on how to use RPE scale       Expected Outcomes Short Term: Able to use RPE daily in rehab to express subjective intensity level;Long Term:  Able to use RPE to guide intensity level when exercising independently       Able to understand and use Dyspnea scale Yes       Intervention Provide education and explanation on how to use Dyspnea scale       Expected Outcomes Short Term: Able to use Dyspnea scale daily in rehab to express subjective sense of shortness of breath during exertion;Long Term: Able to use Dyspnea scale to guide intensity level when exercising  independently       Knowledge and understanding of Target Heart Rate Range (THRR) Yes       Intervention Provide education and explanation of THRR including how the numbers were predicted and where they are located for reference       Expected Outcomes Short Term: Able to state/look up THRR;Short Term: Able to use daily as guideline for intensity in rehab;Long Term: Able to use THRR to govern intensity when exercising independently       Able to check pulse independently Yes       Intervention Provide education and demonstration on how to check pulse in carotid and radial arteries.;Review the importance of being able to check your own pulse for safety during independent exercise       Expected Outcomes Short Term: Able to explain why pulse checking is important during independent exercise;Long Term: Able to check pulse independently and accurately       Understanding of Exercise Prescription Yes       Intervention Provide education, explanation, and written materials on patient's individual exercise prescription       Expected Outcomes Short Term: Able to explain program exercise prescription;Long Term: Able to explain home exercise prescription to exercise independently              Copy of goals given to participant.

## 2019-12-10 NOTE — Progress Notes (Signed)
Cardiac Individual Treatment Plan  Patient Details  Name: Lance [name] MRN: [id number] Date of Birth: 1955-08-26 Referring Provider:     Cardiac Rehab from 12/10/2019 in [facility] Cardiac and Pulmonary Rehab  Referring Provider Delight Stare MD      Initial Encounter Date:    Cardiac Rehab from 12/10/2019 in [facility] Cardiac and Pulmonary Rehab  Date 12/10/19      Visit Diagnosis: NSTEMI (non-ST elevated myocardial infarction) [facility])  Status post coronary artery stent placement  Patient's Home Medications on Admission:  Current Outpatient Medications:    Accu-Chek FastClix Lancets MISC, Apply topically., Disp: , Rfl:    ACCU-CHEK GUIDE test strip, , Disp: , Rfl:    amLODipine (NORVASC) 5 MG tablet, Take 5 mg by mouth daily.   (Patient not taking: Reported on 12/04/2019), Disp: , Rfl:    aspirin 81 MG chewable tablet, Chew by mouth., Disp: , Rfl:    Blood Glucose Monitoring Suppl (FIFTY50 GLUCOSE METER 2.0) w/Device KIT, Disp. blood glucose meter kit preferred by patient's insurance. Dx: Diabetes, E11.9, Disp: , Rfl:    LANTUS SOLOSTAR 100 UNIT/ML Solostar Pen, Inject into the skin., Disp: , Rfl:    linagliptin (TRADJENTA) 5 MG TABS tablet, Take 5 mg by mouth daily. (Patient not taking: Reported on 12/04/2019), Disp: , Rfl:    lisinopril (ZESTRIL) 40 MG tablet, Take 40 mg by mouth daily., Disp: , Rfl:    lisinopril-hydrochlorothiazide (PRINZIDE,ZESTORETIC) 20-12.5 MG per tablet, Take 1 tablet by mouth daily.   (Patient not taking: Reported on 12/04/2019), Disp: , Rfl:    metFORMIN (GLUCOPHAGE-XR) 500 MG 24 hr tablet, Take 1,000 mg by mouth 2 (two) times daily. , Disp: , Rfl:    metoprolol succinate (TOPROL-XL) 50 MG 24 hr tablet, Take 50 mg by mouth daily., Disp: , Rfl:    Multiple Minerals (CALCIUM/MAGNESIUM/ZINC PO), Take by mouth daily. (Patient not taking: Reported on 12/04/2019), Disp: , Rfl:    nitroGLYCERIN (NITROSTAT) 0.4 MG SL tablet, Place under the tongue., Disp: , Rfl:     pioglitazone (ACTOS) 30 MG tablet, Take 30 mg by mouth daily. (Patient not taking: Reported on 12/04/2019), Disp: , Rfl:    prasugrel (EFFIENT) 10 MG TABS tablet, Take 10 mg by mouth daily., Disp: , Rfl:    pravastatin (PRAVACHOL) 40 MG tablet, Take 40 mg by mouth daily., Disp: , Rfl:    TRUEPLUS 5-BEVEL PEN NEEDLES 31G X 6 MM MISC, , Disp: , Rfl:   Past Medical History: Past Medical History:  Diagnosis Date   Anxiety    Arthritis    knees   Complication of anesthesia 2009   during shoulder block-ht rate dropped-had injection-did do surgery-   Depression    Diabetes mellitus    Hyperlipidemia    Hypertension    Trochanteric bursitis of right hip     Tobacco Use: Social History   Tobacco Use  Smoking Status Never Smoker  Smokeless Tobacco Never Used    Labs: Recent Review Flowsheet Data    Labs for ITP Cardiac and Pulmonary Rehab Latest Ref Rng & Units 09/22/2007 06/09/2012   Cholestrol 0 - 200 mg/dL - 155   LDLCALC 0 - 100 mg/dL - 95   HDL 40 - 60 mg/dL - 34(L)   Trlycerides 0 - 200 mg/dL - 128   Hemoglobin A1c 4.2 - 6.3 % - 9.1(H)   TCO2 - 21 -       Exercise Target Goals: Exercise Program Goal: Individual exercise prescription set using results  from initial 6 min walk test and THRR while considering  patients activity barriers and safety.   Exercise Prescription Goal: Initial exercise prescription builds to 30-45 minutes a day of aerobic activity, 2-3 days per week.  Home exercise guidelines will be given to patient during program as part of exercise prescription that the participant will acknowledge.   Education: Aerobic Exercise & Resistance Training: - Gives group verbal and written instruction on the various components of exercise. Focuses on aerobic and resistive training programs and the benefits of this training and how to safely progress through these programs..   Education: Exercise & Equipment Safety: - Individual verbal instruction and  demonstration of equipment use and safety with use of the equipment.   Cardiac Rehab from 12/10/2019 in [facility] Cardiac and Pulmonary Rehab  Date 12/10/19  Educator [place]  Instruction Review Code 1- Verbalizes Understanding      Education: Exercise Physiology & General Exercise Guidelines: - Group verbal and written instruction with models to review the exercise physiology of the cardiovascular system and associated critical values. Provides general exercise guidelines with specific guidelines to those with heart or lung disease.    Education: Flexibility, Balance, Mind/Body Relaxation: Provides group verbal/written instruction on the benefits of flexibility and balance training, including mind/body exercise modes such as yoga, pilates and tai chi.  Demonstration and skill practice provided.   Activity Barriers & Risk Stratification:  Activity Barriers & Cardiac Risk Stratification - 12/10/19 1609      Activity Barriers & Cardiac Risk Stratification   Activity Barriers Joint Problems;Left Knee Replacement;Right Knee Replacement    Cardiac Risk Stratification Moderate           6 Minute Walk:  6 Minute Walk    Row Name 12/10/19 1614         6 Minute Walk   Phase Initial     Distance 1137 feet     Walk Time 6 minutes     # of Rest Breaks 0     MPH 2.15     METS 2.45     RPE 7     Perceived Dyspnea  0     VO2 Peak 8.6     Symptoms No     Resting HR 75 bpm     Resting BP 132/70     Resting Oxygen Saturation  96 %     Exercise Oxygen Saturation  during 6 min walk 98 %     Max Ex. HR 93 bpm     Max Ex. BP 146/74     2 Minute Post BP 116/72            Oxygen Initial Assessment:   Oxygen Re-Evaluation:   Oxygen Discharge (Final Oxygen Re-Evaluation):   Initial Exercise Prescription:  Initial Exercise Prescription - 12/10/19 1600      Date of Initial Exercise RX and Referring Provider   Date 12/10/19    Referring Provider Delight Stare MD      Treadmill   MPH  1.9    Grade 0.5    Minutes 15    METs 2.59      NuStep   Level 2    SPM 80    Minutes 15    METs 2.4      REL-XR   Level 2    Speed 50    Minutes 15    METs 2.4      Biostep-RELP   Level 2    SPM 50  Minutes 15    METs 2.4      Prescription Details   Frequency (times per week) 3    Duration Progress to 30 minutes of continuous aerobic without signs/symptoms of physical distress      Intensity   THRR 40-80% of Max Heartrate 108-142    Ratings of Perceived Exertion 11-13    Perceived Dyspnea 0-4      Progression   Progression Continue to progress workloads to maintain intensity without signs/symptoms of physical distress.      Resistance Training   Training Prescription Yes    Weight 3    Reps 10-15           Perform Capillary Blood Glucose checks as needed.  Exercise Prescription Changes:  Exercise Prescription Changes    Row Name 12/10/19 1600             Response to Exercise   Blood Pressure (Admit) 132/70       Blood Pressure (Exercise) 146/74       Blood Pressure (Exit) 116/72       Heart Rate (Admit) 75 bpm       Heart Rate (Exercise) 93 bpm       Heart Rate (Exit) 75 bpm       Oxygen Saturation (Admit) 96 %       Oxygen Saturation (Exercise) 98 %       Oxygen Saturation (Exit) 96 %       Rating of Perceived Exertion (Exercise) 7       Perceived Dyspnea (Exercise) 0       Symptoms none       Comments walk test results       Duration Progress to 30 minutes of  aerobic without signs/symptoms of physical distress              Exercise Comments:   Exercise Goals and Review:  Exercise Goals    Row Name 12/10/19 1613             Exercise Goals   Increase Physical Activity Yes       Intervention Provide advice, education, support and counseling about physical activity/exercise needs.;Develop an individualized exercise prescription for aerobic and resistive training based on initial evaluation findings, risk stratification,  comorbidities and participant's personal goals.       Expected Outcomes Short Term: Attend rehab on a regular basis to increase amount of physical activity.;Long Term: Add in home exercise to make exercise part of routine and to increase amount of physical activity.;Long Term: Exercising regularly at least 3-5 days a week.       Increase Strength and Stamina Yes       Intervention Provide advice, education, support and counseling about physical activity/exercise needs.;Develop an individualized exercise prescription for aerobic and resistive training based on initial evaluation findings, risk stratification, comorbidities and participant's personal goals.       Expected Outcomes Short Term: Increase workloads from initial exercise prescription for resistance, speed, and METs.;Short Term: Perform resistance training exercises routinely during rehab and add in resistance training at home;Long Term: Improve cardiorespiratory fitness, muscular endurance and strength as measured by increased METs and functional capacity (6MWT)       Able to understand and use rate of perceived exertion (RPE) scale Yes       Intervention Provide education and explanation on how to use RPE scale       Expected Outcomes Short Term: Able to use RPE daily in rehab to  express subjective intensity level;Long Term:  Able to use RPE to guide intensity level when exercising independently       Able to understand and use Dyspnea scale Yes       Intervention Provide education and explanation on how to use Dyspnea scale       Expected Outcomes Short Term: Able to use Dyspnea scale daily in rehab to express subjective sense of shortness of breath during exertion;Long Term: Able to use Dyspnea scale to guide intensity level when exercising independently       Knowledge and understanding of Target Heart Rate Range (THRR) Yes       Intervention Provide education and explanation of THRR including how the numbers were predicted and where they  are located for reference       Expected Outcomes Short Term: Able to state/look up THRR;Short Term: Able to use daily as guideline for intensity in rehab;Long Term: Able to use THRR to govern intensity when exercising independently       Able to check pulse independently Yes       Intervention Provide education and demonstration on how to check pulse in carotid and radial arteries.;Review the importance of being able to check your own pulse for safety during independent exercise       Expected Outcomes Short Term: Able to explain why pulse checking is important during independent exercise;Long Term: Able to check pulse independently and accurately       Understanding of Exercise Prescription Yes       Intervention Provide education, explanation, and written materials on patient's individual exercise prescription       Expected Outcomes Short Term: Able to explain program exercise prescription;Long Term: Able to explain home exercise prescription to exercise independently              Exercise Goals Re-Evaluation :   Discharge Exercise Prescription (Final Exercise Prescription Changes):  Exercise Prescription Changes - 12/10/19 1600      Response to Exercise   Blood Pressure (Admit) 132/70    Blood Pressure (Exercise) 146/74    Blood Pressure (Exit) 116/72    Heart Rate (Admit) 75 bpm    Heart Rate (Exercise) 93 bpm    Heart Rate (Exit) 75 bpm    Oxygen Saturation (Admit) 96 %    Oxygen Saturation (Exercise) 98 %    Oxygen Saturation (Exit) 96 %    Rating of Perceived Exertion (Exercise) 7    Perceived Dyspnea (Exercise) 0    Symptoms none    Comments walk test results    Duration Progress to 30 minutes of  aerobic without signs/symptoms of physical distress           Nutrition:  Target Goals: Understanding of nutrition guidelines, daily intake of sodium '1500mg'$ , cholesterol '200mg'$ , calories 30% from fat and 7% or less from saturated fats, daily to have 5 or more servings of  fruits and vegetables.  Education: Controlling Sodium/Reading Food Labels -Group verbal and written material supporting the discussion of sodium use in heart healthy nutrition. Review and explanation with models, verbal and written materials for utilization of the food label.   Education: General Nutrition Guidelines/Fats and Fiber: -Group instruction provided by verbal, written material, models and posters to present the general guidelines for heart healthy nutrition. Gives an explanation and review of dietary fats and fiber.   Biometrics:  Pre Biometrics - 12/10/19 1615      Pre Biometrics   Height 5' 8.8" (1.748 m)  Weight 263 lb 6.4 oz (119.5 kg)    BMI (Calculated) 39.1    Single Leg Stand 10 seconds            Nutrition Therapy Plan and Nutrition Goals:   Nutrition Assessments:  Nutrition Assessments - 12/10/19 1559      MEDFICTS Scores   Pre Score 37           MEDIFICTS Score Key:          ?70 Need to make dietary changes          40-70 Heart Healthy Diet         ? 40 Therapeutic Level Cholesterol Diet  Nutrition Goals Re-Evaluation:   Nutrition Goals Discharge (Final Nutrition Goals Re-Evaluation):   Psychosocial: Target Goals: Acknowledge presence or absence of significant depression and/or stress, maximize coping skills, provide positive support system. Participant is able to verbalize types and ability to use techniques and skills needed for reducing stress and depression.   Education: Depression - Provides group verbal and written instruction on the correlation between heart/lung disease and depressed mood, treatment options, and the stigmas associated with seeking treatment.   Education: Sleep Hygiene -Provides group verbal and written instruction about how sleep can affect your health.  Define sleep hygiene, discuss sleep cycles and impact of sleep habits. Review good sleep hygiene tips.     Education: Stress and Anxiety: - Provides group  verbal and written instruction about the health risks of elevated stress and causes of high stress.  Discuss the correlation between heart/lung disease and anxiety and treatment options. Review healthy ways to manage with stress and anxiety.    Initial Review & Psychosocial Screening:  Initial Psych Review & Screening - 12/04/19 1310      Initial Review   Current issues with None Identified      Family Dynamics   Good Support System? Yes      Barriers   Psychosocial barriers to participate in program There are no identifiable barriers or psychosocial needs.;The patient should benefit from training in stress management and relaxation.      Screening Interventions   Interventions Encouraged to exercise;To provide support and resources with identified psychosocial needs;Provide feedback about the scores to participant    Expected Outcomes Short Term goal: Utilizing psychosocial counselor, staff and physician to assist with identification of specific Stressors or current issues interfering with healing process. Setting desired goal for each stressor or current issue identified.;Long Term Goal: Stressors or current issues are controlled or eliminated.;Short Term goal: Identification and review with participant of any Quality of Life or Depression concerns found by scoring the questionnaire.;Long Term goal: The participant improves quality of Life and PHQ9 Scores as seen by post scores and/or verbalization of changes           Quality of Life Scores:   Quality of Life - 12/10/19 1601      Quality of Life   Select Quality of Life      Quality of Life Scores   Health/Function Pre 28 %    Socioeconomic Pre 27 %    Psych/Spiritual Pre 28.29 %    Family Pre 30 %    GLOBAL Pre 28.18 %          Scores of 19 and below usually indicate a poorer quality of life in these areas.  A difference of  2-3 points is a clinically meaningful difference.  A difference of 2-3 points in the total score of  the  Quality of Life Index has been associated with significant improvement in overall quality of life, self-image, physical symptoms, and general health in studies assessing change in quality of life.  PHQ-9: Recent Review Flowsheet Data    Depression screen [facility] 2/9 12/10/2019   Decreased Interest 0   Down, Depressed, Hopeless 0   PHQ - 2 Score 0   Altered sleeping 0   Tired, decreased energy 0   Change in appetite 0   Trouble concentrating 0   Moving slowly or fidgety/restless 0   Suicidal thoughts 0   PHQ-9 Score 0   Difficult doing work/chores Not difficult at all     Interpretation of Total Score  Total Score Depression Severity:  1-4 = Minimal depression, 5-9 = Mild depression, 10-14 = Moderate depression, 15-19 = Moderately severe depression, 20-27 = Severe depression   Psychosocial Evaluation and Intervention:  Psychosocial Evaluation - 12/04/19 1331      Psychosocial Evaluation & Interventions   Comments Imad reports doing well post MI. He is looking forward to learning what he can and can't do so he can get back to exercise. He has been on disability since 1996 due to both knees requiring multiple surgeries. He states things are going well for he and his wife, now that she can't fuss at him because he can't get all worked up after his MI.    Expected Outcomes Short: attend cardiac rehab for educaion and exercise. Long: develop positive self care habits.    Continue Psychosocial Services  Follow up required by staff           Psychosocial Re-Evaluation:   Psychosocial Discharge (Final Psychosocial Re-Evaluation):   Vocational Rehabilitation: Provide vocational rehab assistance to qualifying candidates.   Vocational Rehab Evaluation & Intervention:  Vocational Rehab - 12/04/19 1310      Initial Vocational Rehab Evaluation & Intervention   Assessment shows need for Vocational Rehabilitation No           Education: Education Goals: Education classes will be  provided on a variety of topics geared toward better understanding of heart health and risk factor modification. Participant will state understanding/return demonstration of topics presented as noted by education test scores.  Learning Barriers/Preferences:  Learning Barriers/Preferences - 12/04/19 1310      Learning Barriers/Preferences   Learning Barriers None    Learning Preferences None           General Cardiac Education Topics:  AED/CPR: - Group verbal and written instruction with the use of models to demonstrate the basic use of the AED with the basic ABC's of resuscitation.   Anatomy & Physiology of the Heart: - Group verbal and written instruction and models provide basic cardiac anatomy and physiology, with the coronary electrical and arterial systems. Review of Valvular disease and Heart Failure   Cardiac Procedures: - Group verbal and written instruction to review commonly prescribed medications for heart disease. Reviews the medication, class of the drug, and side effects. Includes the steps to properly store meds and maintain the prescription regimen. (beta blockers and nitrates)   Cardiac Medications I: - Group verbal and written instruction to review commonly prescribed medications for heart disease. Reviews the medication, class of the drug, and side effects. Includes the steps to properly store meds and maintain the prescription regimen.   Cardiac Medications II: -Group verbal and written instruction to review commonly prescribed medications for heart disease. Reviews the medication, class of the drug, and side effects. (all other drug classes)  Go Sex-Intimacy & Heart Disease, Get SMART - Goal Setting: - Group verbal and written instruction through game format to discuss heart disease and the return to sexual intimacy. Provides group verbal and written material to discuss and apply goal setting through the application of the S.M.A.R.T. Method.   Other  Matters of the Heart: - Provides group verbal, written materials and models to describe Stable Angina and Peripheral Artery. Includes description of the disease process and treatment options available to the cardiac patient.   Infection Prevention: - Provides verbal and written material to individual with discussion of infection control including proper hand washing and proper equipment cleaning during exercise session.   Cardiac Rehab from 12/10/2019 in [facility] Cardiac and Pulmonary Rehab  Date 12/10/19  Educator [place]  Instruction Review Code 1- Verbalizes Understanding      Falls Prevention: - Provides verbal and written material to individual with discussion of falls prevention and safety.   Cardiac Rehab from 12/10/2019 in [facility] Cardiac and Pulmonary Rehab  Date 12/10/19  Educator [place]  Instruction Review Code 1- Verbalizes Understanding      Other: -Provides group and verbal instruction on various topics (see comments)   Knowledge Questionnaire Score:  Knowledge Questionnaire Score - 12/10/19 1557      Knowledge Questionnaire Score   Pre Score 19/26: Heart Failure, Heart Rate, Exercise, Nutrition           Core Components/Risk Factors/Patient Goals at Admission:  Personal Goals and Risk Factors at Admission - 12/10/19 1616      Core Components/Risk Factors/Patient Goals on Admission    Weight Management Yes;Weight Loss    Intervention Weight Management: Develop a combined nutrition and exercise program designed to reach desired caloric intake, while maintaining appropriate intake of nutrient and fiber, sodium and fats, and appropriate energy expenditure required for the weight goal.;Weight Management: Provide education and appropriate resources to help participant work on and attain dietary goals.;Weight Management/Obesity: Establish reasonable short term and long term weight goals.    Admit Weight 263 lb 6.4 oz (119.5 kg)    Goal Weight: Short Term 258 lb (117 kg)    Goal  Weight: Long Term 253 lb (114.8 kg)    Expected Outcomes Short Term: Continue to assess and modify interventions until short term weight is achieved;Long Term: Adherence to nutrition and physical activity/exercise program aimed toward attainment of established weight goal;Weight Loss: Understanding of general recommendations for a balanced deficit meal plan, which promotes 1-2 lb weight loss per week and includes a negative energy balance of [phone] kcal/d;Understanding recommendations for meals to include 15-35% energy as protein, 25-35% energy from fat, 35-60% energy from carbohydrates, less than $RemoveB'200mg'CRSkNlmg$  of dietary cholesterol, 20-35 gm of total fiber daily;Understanding of distribution of calorie intake throughout the day with the consumption of 4-5 meals/snacks    Diabetes Yes    Intervention Provide education about signs/symptoms and action to take for hypo/hyperglycemia.;Provide education about proper nutrition, including hydration, and aerobic/resistive exercise prescription along with prescribed medications to achieve blood glucose in normal ranges: Fasting glucose 65-99 mg/dL    Expected Outcomes Short Term: Participant verbalizes understanding of the signs/symptoms and immediate care of hyper/hypoglycemia, proper foot care and importance of medication, aerobic/resistive exercise and nutrition plan for blood glucose control.;Long Term: Attainment of HbA1C < 7%.    Hypertension Yes    Intervention Provide education on lifestyle modifcations including regular physical activity/exercise, weight management, moderate sodium restriction and increased consumption of fresh fruit, vegetables, and low fat dairy, alcohol moderation,  and smoking cessation.;Monitor prescription use compliance.    Expected Outcomes Short Term: Continued assessment and intervention until BP is < 140/40mm HG in hypertensive participants. < 130/55mm HG in hypertensive participants with diabetes, heart failure or chronic kidney  disease.;Long Term: Maintenance of blood pressure at goal levels.    Lipids Yes    Intervention Provide education and support for participant on nutrition & aerobic/resistive exercise along with prescribed medications to achieve LDL '70mg'$ , HDL >$Remo'40mg'LkFoR$ .    Expected Outcomes Short Term: Participant states understanding of desired cholesterol values and is compliant with medications prescribed. Participant is following exercise prescription and nutrition guidelines.;Long Term: Cholesterol controlled with medications as prescribed, with individualized exercise RX and with personalized nutrition plan. Value goals: LDL < $Rem'70mg'AkrT$ , HDL > 40 mg.           Education:Diabetes - Individual verbal and written instruction to review signs/symptoms of diabetes, desired ranges of glucose level fasting, after meals and with exercise. Acknowledge that pre and post exercise glucose checks will be done for 3 sessions at entry of program.   Cardiac Rehab from 12/10/2019 in [facility]  Date 12/10/19  Educator [place]  Instruction Review Code 1- Verbalizes Understanding      Education: Know Your Numbers and Risk Factors: -Group verbal and written instruction about important numbers in your health.  Discussion of what are risk factors and how they play a role in the disease process.  Review of Cholesterol, Blood Pressure, Diabetes, and BMI and the role they play in your overall health.   Core Components/Risk Factors/Patient Goals Review:    Core Components/Risk Factors/Patient Goals at Discharge (Final Review):    ITP Comments:  ITP Comments    Row Name 12/04/19 1303 12/10/19 1553         ITP Comments Initial orientation completed. Diagnosis can be found in [facility] 8/4. EP orientation scheduled for 9/2 at 2:30 Completed 6MWT and gym orientation. Initial ITP created and sent for review to Dr. Emily Filbert, Medical Director.             Comments: Initial ITP

## 2019-12-16 ENCOUNTER — Encounter: Payer: Medicare Other | Admitting: *Deleted

## 2019-12-16 ENCOUNTER — Other Ambulatory Visit: Payer: Self-pay

## 2019-12-16 ENCOUNTER — Encounter: Payer: Self-pay | Admitting: *Deleted

## 2019-12-16 DIAGNOSIS — I214 Non-ST elevation (NSTEMI) myocardial infarction: Secondary | ICD-10-CM

## 2019-12-16 DIAGNOSIS — Z955 Presence of coronary angioplasty implant and graft: Secondary | ICD-10-CM

## 2019-12-16 LAB — GLUCOSE, CAPILLARY
Glucose-Capillary: 256 mg/dL — ABNORMAL HIGH (ref 70–99)
Glucose-Capillary: 188 mg/dL — ABNORMAL HIGH (ref 70–99)

## 2019-12-16 NOTE — Progress Notes (Signed)
Cardiac Individual Treatment Plan  Patient Details  Name: Lance [name] MRN: [id number] Date of Birth: May 25, 1955 Referring Provider:     Cardiac Rehab from 12/10/2019 in [facility] Cardiac and Pulmonary Rehab  Referring Provider Delight Stare MD      Initial Encounter Date:    Cardiac Rehab from 12/10/2019 in [facility] Cardiac and Pulmonary Rehab  Date 12/10/19      Visit Diagnosis: NSTEMI (non-ST elevated myocardial infarction) [facility])  Status post coronary artery stent placement  Patient's Home Medications on Admission:  Current Outpatient Medications:  .  Accu-Chek FastClix Lancets MISC, Apply topically., Disp: , Rfl:  .  ACCU-CHEK GUIDE test strip, , Disp: , Rfl:  .  amLODipine (NORVASC) 5 MG tablet, Take 5 mg by mouth daily.   (Patient not taking: Reported on 12/04/2019), Disp: , Rfl:  .  Blood Glucose Monitoring Suppl (FIFTY50 GLUCOSE METER 2.0) w/Device KIT, Disp. blood glucose meter kit preferred by patient's insurance. Dx: Diabetes, E11.9, Disp: , Rfl:  .  LANTUS SOLOSTAR 100 UNIT/ML Solostar Pen, Inject into the skin., Disp: , Rfl:  .  linagliptin (TRADJENTA) 5 MG TABS tablet, Take 5 mg by mouth daily. (Patient not taking: Reported on 12/04/2019), Disp: , Rfl:  .  lisinopril (ZESTRIL) 40 MG tablet, Take 40 mg by mouth daily., Disp: , Rfl:  .  lisinopril-hydrochlorothiazide (PRINZIDE,ZESTORETIC) 20-12.5 MG per tablet, Take 1 tablet by mouth daily.   (Patient not taking: Reported on 12/04/2019), Disp: , Rfl:  .  metFORMIN (GLUCOPHAGE-XR) 500 MG 24 hr tablet, Take 1,000 mg by mouth 2 (two) times daily. , Disp: , Rfl:  .  metoprolol succinate (TOPROL-XL) 50 MG 24 hr tablet, Take 50 mg by mouth daily., Disp: , Rfl:  .  Multiple Minerals (CALCIUM/MAGNESIUM/ZINC PO), Take by mouth daily. (Patient not taking: Reported on 12/04/2019), Disp: , Rfl:  .  nitroGLYCERIN (NITROSTAT) 0.4 MG SL tablet, Place under the tongue., Disp: , Rfl:  .  pioglitazone (ACTOS) 30 MG tablet, Take 30 mg by mouth daily.  (Patient not taking: Reported on 12/04/2019), Disp: , Rfl:  .  prasugrel (EFFIENT) 10 MG TABS tablet, Take 10 mg by mouth daily., Disp: , Rfl:  .  pravastatin (PRAVACHOL) 40 MG tablet, Take 40 mg by mouth daily., Disp: , Rfl:  .  TRUEPLUS 5-BEVEL PEN NEEDLES 31G X 6 MM MISC, , Disp: , Rfl:   Past Medical History: Past Medical History:  Diagnosis Date  . Anxiety   . Arthritis    knees  . Complication of anesthesia 2009   during shoulder block-ht rate dropped-had injection-did do surgery-  . Depression   . Diabetes mellitus   . Hyperlipidemia   . Hypertension   . Trochanteric bursitis of right hip     Tobacco Use: Social History   Tobacco Use  Smoking Status Never Smoker  Smokeless Tobacco Never Used    Labs: Recent Review Flowsheet Data    Labs for ITP Cardiac and Pulmonary Rehab Latest Ref Rng & Units 09/22/2007 06/09/2012   Cholestrol 0 - 200 mg/dL - 155   LDLCALC 0 - 100 mg/dL - 95   HDL 40 - 60 mg/dL - 34(L)   Trlycerides 0 - 200 mg/dL - 128   Hemoglobin A1c 4.2 - 6.3 % - 9.1(H)   TCO2 - 21 -       Exercise Target Goals: Exercise Program Goal: Individual exercise prescription set using results from initial 6 min walk test and THRR while considering  patient's activity barriers  and safety.   Exercise Prescription Goal: Initial exercise prescription builds to 30-45 minutes a day of aerobic activity, 2-3 days per week.  Home exercise guidelines will be given to patient during program as part of exercise prescription that the participant will acknowledge.   Education: Aerobic Exercise & Resistance Training: - Gives group verbal and written instruction on the various components of exercise. Focuses on aerobic and resistive training programs and the benefits of this training and how to safely progress through these programs..   Education: Exercise & Equipment Safety: - Individual verbal instruction and demonstration of equipment use and safety with use of the  equipment.   Cardiac Rehab from 12/16/2019 in [facility] Cardiac and Pulmonary Rehab  Date 12/10/19  Educator [place]  Instruction Review Code 1- Verbalizes Understanding      Education: Exercise Physiology & General Exercise Guidelines: - Group verbal and written instruction with models to review the exercise physiology of the cardiovascular system and associated critical values. Provides general exercise guidelines with specific guidelines to those with heart or lung disease.    Education: Flexibility, Balance, Mind/Body Relaxation: Provides group verbal/written instruction on the benefits of flexibility and balance training, including mind/body exercise modes such as yoga, pilates and tai chi.  Demonstration and skill practice provided.   Activity Barriers & Risk Stratification:  Activity Barriers & Cardiac Risk Stratification - 12/10/19 1609      Activity Barriers & Cardiac Risk Stratification   Activity Barriers Joint Problems;Left Knee Replacement;Right Knee Replacement    Cardiac Risk Stratification Moderate           6 Minute Walk:  6 Minute Walk    Row Name 12/10/19 1614         6 Minute Walk   Phase Initial     Distance 1137 feet     Walk Time 6 minutes     # of Rest Breaks 0     MPH 2.15     METS 2.45     RPE 7     Perceived Dyspnea  0     VO2 Peak 8.6     Symptoms No     Resting HR 75 bpm     Resting BP 132/70     Resting Oxygen Saturation  96 %     Exercise Oxygen Saturation  during 6 min walk 98 %     Max Ex. HR 93 bpm     Max Ex. BP 146/74     2 Minute Post BP 116/72            Oxygen Initial Assessment:   Oxygen Re-Evaluation:   Oxygen Discharge (Final Oxygen Re-Evaluation):   Initial Exercise Prescription:  Initial Exercise Prescription - 12/10/19 1600      Date of Initial Exercise RX and Referring Provider   Date 12/10/19    Referring Provider Delight Stare MD      Treadmill   MPH 1.9    Grade 0.5    Minutes 15    METs 2.59      NuStep    Level 2    SPM 80    Minutes 15    METs 2.4      REL-XR   Level 2    Speed 50    Minutes 15    METs 2.4      Biostep-RELP   Level 2    SPM 50    Minutes 15    METs 2.4      Prescription  Details   Frequency (times per week) 3    Duration Progress to 30 minutes of continuous aerobic without signs/symptoms of physical distress      Intensity   THRR 40-80% of Max Heartrate 108-142    Ratings of Perceived Exertion 11-13    Perceived Dyspnea 0-4      Progression   Progression Continue to progress workloads to maintain intensity without signs/symptoms of physical distress.      Resistance Training   Training Prescription Yes    Weight 3    Reps 10-15           Perform Capillary Blood Glucose checks as needed.  Exercise Prescription Changes:  Exercise Prescription Changes    Row Name 12/10/19 1600             Response to Exercise   Blood Pressure (Admit) 132/70       Blood Pressure (Exercise) 146/74       Blood Pressure (Exit) 116/72       Heart Rate (Admit) 75 bpm       Heart Rate (Exercise) 93 bpm       Heart Rate (Exit) 75 bpm       Oxygen Saturation (Admit) 96 %       Oxygen Saturation (Exercise) 98 %       Oxygen Saturation (Exit) 96 %       Rating of Perceived Exertion (Exercise) 7       Perceived Dyspnea (Exercise) 0       Symptoms none       Comments walk test results       Duration Progress to 30 minutes of  aerobic without signs/symptoms of physical distress              Exercise Comments:   Exercise Goals and Review:  Exercise Goals    Row Name 12/10/19 1613             Exercise Goals   Increase Physical Activity Yes       Intervention Provide advice, education, support and counseling about physical activity/exercise needs.;Develop an individualized exercise prescription for aerobic and resistive training based on initial evaluation findings, risk stratification, comorbidities and participant's personal goals.       Expected  Outcomes Short Term: Attend rehab on a regular basis to increase amount of physical activity.;Long Term: Add in home exercise to make exercise part of routine and to increase amount of physical activity.;Long Term: Exercising regularly at least 3-5 days a week.       Increase Strength and Stamina Yes       Intervention Provide advice, education, support and counseling about physical activity/exercise needs.;Develop an individualized exercise prescription for aerobic and resistive training based on initial evaluation findings, risk stratification, comorbidities and participant's personal goals.       Expected Outcomes Short Term: Increase workloads from initial exercise prescription for resistance, speed, and METs.;Short Term: Perform resistance training exercises routinely during rehab and add in resistance training at home;Long Term: Improve cardiorespiratory fitness, muscular endurance and strength as measured by increased METs and functional capacity ( )       Able to understand and use rate of perceived exertion (RPE) scale Yes       Intervention Provide education and explanation on how to use RPE scale       Expected Outcomes Short Term: Able to use RPE daily in rehab to express subjective intensity level;Long Term:  Able to use RPE to guide intensity  level when exercising independently       Able to understand and use Dyspnea scale Yes       Intervention Provide education and explanation on how to use Dyspnea scale       Expected Outcomes Short Term: Able to use Dyspnea scale daily in rehab to express subjective sense of shortness of breath during exertion;Long Term: Able to use Dyspnea scale to guide intensity level when exercising independently       Knowledge and understanding of Target Heart Rate Range (THRR) Yes       Intervention Provide education and explanation of THRR including how the numbers were predicted and where they are located for reference       Expected Outcomes Short Term:  Able to state/look up THRR;Short Term: Able to use daily as guideline for intensity in rehab;Long Term: Able to use THRR to govern intensity when exercising independently       Able to check pulse independently Yes       Intervention Provide education and demonstration on how to check pulse in carotid and radial arteries.;Review the importance of being able to check your own pulse for safety during independent exercise       Expected Outcomes Short Term: Able to explain why pulse checking is important during independent exercise;Long Term: Able to check pulse independently and accurately       Understanding of Exercise Prescription Yes       Intervention Provide education, explanation, and written materials on patient's individual exercise prescription       Expected Outcomes Short Term: Able to explain program exercise prescription;Long Term: Able to explain home exercise prescription to exercise independently              Exercise Goals Re-Evaluation :  Exercise Goals Re-Evaluation    Row Name 12/16/19 1015             Exercise Goal Re-Evaluation   Exercise Goals Review Increase Physical Activity;Able to understand and use rate of perceived exertion (RPE) scale;Understanding of Exercise Prescription;Knowledge and understanding of Target Heart Rate Range (THRR);Increase Strength and Stamina;Able to check pulse independently       Comments Reviewed RPE and dyspnea scales, THR and program prescription with pt today.  Pt voiced understanding and was given a copy of goals to take home.       Expected Outcomes Short: Use RPE daily to regulate intensity. Long: Follow program prescription in THR.              Discharge Exercise Prescription (Final Exercise Prescription Changes):  Exercise Prescription Changes - 12/10/19 1600      Response to Exercise   Blood Pressure (Admit) 132/70    Blood Pressure (Exercise) 146/74    Blood Pressure (Exit) 116/72    Heart Rate (Admit) 75 bpm    Heart  Rate (Exercise) 93 bpm    Heart Rate (Exit) 75 bpm    Oxygen Saturation (Admit) 96 %    Oxygen Saturation (Exercise) 98 %    Oxygen Saturation (Exit) 96 %    Rating of Perceived Exertion (Exercise) 7    Perceived Dyspnea (Exercise) 0    Symptoms none    Comments walk test results    Duration Progress to 30 minutes of  aerobic without signs/symptoms of physical distress           Nutrition:  Target Goals: Understanding of nutrition guidelines, daily intake of sodium '1500mg'$ , cholesterol '200mg'$ , calories 30% from fat and 7%  or less from saturated fats, daily to have 5 or more servings of fruits and vegetables.  Education: Controlling Sodium/Reading Food Labels -Group verbal and written material supporting the discussion of sodium use in heart healthy nutrition. Review and explanation with models, verbal and written materials for utilization of the food label.   Education: General Nutrition Guidelines/Fats and Fiber: -Group instruction provided by verbal, written material, models and posters to present the general guidelines for heart healthy nutrition. Gives an explanation and review of dietary fats and fiber.   Cardiac Rehab from 12/16/2019 in [facility] Cardiac and Pulmonary Rehab  Date 12/16/19  Educator [facility]  Instruction Review Code 1- Verbalizes Understanding      Biometrics:  Pre Biometrics - 12/10/19 1615      Pre Biometrics   Height 5' 8.8" (1.748 m)    Weight 263 lb 6.4 oz (119.5 kg)    BMI (Calculated) 39.1    Single Leg Stand 10 seconds            Nutrition Therapy Plan and Nutrition Goals:   Nutrition Assessments:  Nutrition Assessments - 12/10/19 1559      MEDFICTS Scores   Pre Score 37           MEDIFICTS Score Key:          ?70 Need to make dietary changes          40-70 Heart Healthy Diet         ? 40 Therapeutic Level Cholesterol Diet  Nutrition Goals Re-Evaluation:   Nutrition Goals Discharge (Final Nutrition Goals  Re-Evaluation):   Psychosocial: Target Goals: Acknowledge presence or absence of significant depression and/or stress, maximize coping skills, provide positive support system. Participant is able to verbalize types and ability to use techniques and skills needed for reducing stress and depression.   Education: Depression - Provides group verbal and written instruction on the correlation between heart/lung disease and depressed mood, treatment options, and the stigmas associated with seeking treatment.   Education: Sleep Hygiene -Provides group verbal and written instruction about how sleep can affect your health.  Define sleep hygiene, discuss sleep cycles and impact of sleep habits. Review good sleep hygiene tips.     Education: Stress and Anxiety: - Provides group verbal and written instruction about the health risks of elevated stress and causes of high stress.  Discuss the correlation between heart/lung disease and anxiety and treatment options. Review healthy ways to manage with stress and anxiety.    Initial Review & Psychosocial Screening:  Initial Psych Review & Screening - 12/04/19 1310      Initial Review   Current issues with None Identified      Family Dynamics   Good Support System? Yes      Barriers   Psychosocial barriers to participate in program There are no identifiable barriers or psychosocial needs.;The patient should benefit from training in stress management and relaxation.      Screening Interventions   Interventions Encouraged to exercise;To provide support and resources with identified psychosocial needs;Provide feedback about the scores to participant    Expected Outcomes Short Term goal: Utilizing psychosocial counselor, staff and physician to assist with identification of specific Stressors or current issues interfering with healing process. Setting desired goal for each stressor or current issue identified.;Long Term Goal: Stressors or current issues are  controlled or eliminated.;Short Term goal: Identification and review with participant of any Quality of Life or Depression concerns found by scoring the questionnaire.;Long Term goal: The participant  improves quality of Life and PHQ9 Scores as seen by post scores and/or verbalization of changes           Quality of Life Scores:   Quality of Life - 12/10/19 1601      Quality of Life   Select Quality of Life      Quality of Life Scores   Health/Function Pre 28 %    Socioeconomic Pre 27 %    Psych/Spiritual Pre 28.29 %    Family Pre 30 %    GLOBAL Pre 28.18 %          Scores of 19 and below usually indicate a poorer quality of life in these areas.  A difference of  2-3 points is a clinically meaningful difference.  A difference of 2-3 points in the total score of the Quality of Life Index has been associated with significant improvement in overall quality of life, self-image, physical symptoms, and general health in studies assessing change in quality of life.  PHQ-9: Recent Review Flowsheet Data    Depression screen [facility] 2/9 12/10/2019   Decreased Interest 0   Down, Depressed, Hopeless 0   PHQ - 2 Score 0   Altered sleeping 0   Tired, decreased energy 0   Change in appetite 0   Trouble concentrating 0   Moving slowly or fidgety/restless 0   Suicidal thoughts 0   PHQ-9 Score 0   Difficult doing work/chores Not difficult at all     Interpretation of Total Score  Total Score Depression Severity:  1-4 = Minimal depression, 5-9 = Mild depression, 10-14 = Moderate depression, 15-19 = Moderately severe depression, 20-27 = Severe depression   Psychosocial Evaluation and Intervention:  Psychosocial Evaluation - 12/04/19 1331      Psychosocial Evaluation & Interventions   Comments Lance [name] reports doing well post MI. He is looking forward to learning what he can and can't do so he can get back to exercise. He has been on disability since 1996 due to both knees requiring multiple  surgeries. He states things are going well for he and his wife, now that she can't fuss at him because he can't get all worked up after his MI.    Expected Outcomes Short: attend cardiac rehab for educaion and exercise. Long: develop positive self care habits.    Continue Psychosocial Services  Follow up required by staff           Psychosocial Re-Evaluation:   Psychosocial Discharge (Final Psychosocial Re-Evaluation):   Vocational Rehabilitation: Provide vocational rehab assistance to qualifying candidates.   Vocational Rehab Evaluation & Intervention:  Vocational Rehab - 12/04/19 1310      Initial Vocational Rehab Evaluation & Intervention   Assessment shows need for Vocational Rehabilitation No           Education: Education Goals: Education classes will be provided on a variety of topics geared toward better understanding of heart health and risk factor modification. Participant will state understanding/return demonstration of topics presented as noted by education test scores.  Learning Barriers/Preferences:  Learning Barriers/Preferences - 12/04/19 1310      Learning Barriers/Preferences   Learning Barriers None    Learning Preferences None           General Cardiac Education Topics:  AED/CPR: - Group verbal and written instruction with the use of models to demonstrate the basic use of the AED with the basic ABC's of resuscitation.   Anatomy & Physiology of the Heart: - Group verbal  and written instruction and models provide basic cardiac anatomy and physiology, with the coronary electrical and arterial systems. Review of Valvular disease and Heart Failure   Cardiac Procedures: - Group verbal and written instruction to review commonly prescribed medications for heart disease. Reviews the medication, class of the drug, and side effects. Includes the steps to properly store meds and maintain the prescription regimen. (beta blockers and nitrates)   Cardiac  Medications I: - Group verbal and written instruction to review commonly prescribed medications for heart disease. Reviews the medication, class of the drug, and side effects. Includes the steps to properly store meds and maintain the prescription regimen.   Cardiac Medications II: -Group verbal and written instruction to review commonly prescribed medications for heart disease. Reviews the medication, class of the drug, and side effects. (all other drug classes)    Go Sex-Intimacy & Heart Disease, Get SMART - Goal Setting: - Group verbal and written instruction through game format to discuss heart disease and the return to sexual intimacy. Provides group verbal and written material to discuss and apply goal setting through the application of the S.M.A.R.T. Method.   Other Matters of the Heart: - Provides group verbal, written materials and models to describe Stable Angina and Peripheral Artery. Includes description of the disease process and treatment options available to the cardiac patient.   Infection Prevention: - Provides verbal and written material to individual with discussion of infection control including proper hand washing and proper equipment cleaning during exercise session.   Cardiac Rehab from 12/16/2019 in [facility] Cardiac and Pulmonary Rehab  Date 12/10/19  Educator [place]  Instruction Review Code 1- Verbalizes Understanding      Falls Prevention: - Provides verbal and written material to individual with discussion of falls prevention and safety.   Cardiac Rehab from 12/16/2019 in [facility] Cardiac and Pulmonary Rehab  Date 12/10/19  Educator [place]  Instruction Review Code 1- Verbalizes Understanding      Other: -Provides group and verbal instruction on various topics (see comments)   Knowledge Questionnaire Score:  Knowledge Questionnaire Score - 12/10/19 1557      Knowledge Questionnaire Score   Pre Score 19/26: Heart Failure, Heart Rate, Exercise, Nutrition            Core Components/Risk Factors/Patient Goals at Admission:  Personal Goals and Risk Factors at Admission - 12/10/19 1616      Core Components/Risk Factors/Patient Goals on Admission    Weight Management Yes;Weight Loss    Intervention Weight Management: Develop a combined nutrition and exercise program designed to reach desired caloric intake, while maintaining appropriate intake of nutrient and fiber, sodium and fats, and appropriate energy expenditure required for the weight goal.;Weight Management: Provide education and appropriate resources to help participant work on and attain dietary goals.;Weight Management/Obesity: Establish reasonable short term and long term weight goals.    Admit Weight 263 lb 6.4 oz (119.5 kg)    Goal Weight: Short Term 258 lb (117 kg)    Goal Weight: Long Term 253 lb (114.8 kg)    Expected Outcomes Short Term: Continue to assess and modify interventions until short term weight is achieved;Long Term: Adherence to nutrition and physical activity/exercise program aimed toward attainment of established weight goal;Weight Loss: Understanding of general recommendations for a balanced deficit meal plan, which promotes 1-2 lb weight loss per week and includes a negative energy balance of [phone] kcal/d;Understanding recommendations for meals to include 15-35% energy as protein, 25-35% energy from fat, 35-60% energy from carbohydrates, less  than '200mg'$  of dietary cholesterol, 20-35 gm of total fiber daily;Understanding of distribution of calorie intake throughout the day with the consumption of 4-5 meals/snacks    Diabetes Yes    Intervention Provide education about signs/symptoms and action to take for hypo/hyperglycemia.;Provide education about proper nutrition, including hydration, and aerobic/resistive exercise prescription along with prescribed medications to achieve blood glucose in normal ranges: Fasting glucose 65-99 mg/dL    Expected Outcomes Short Term: Participant  verbalizes understanding of the signs/symptoms and immediate care of hyper/hypoglycemia, proper foot care and importance of medication, aerobic/resistive exercise and nutrition plan for blood glucose control.;Long Term: Attainment of HbA1C < 7%.    Hypertension Yes    Intervention Provide education on lifestyle modifcations including regular physical activity/exercise, weight management, moderate sodium restriction and increased consumption of fresh fruit, vegetables, and low fat dairy, alcohol moderation, and smoking cessation.;Monitor prescription use compliance.    Expected Outcomes Short Term: Continued assessment and intervention until BP is < 140/46mm HG in hypertensive participants. < 130/16mm HG in hypertensive participants with diabetes, heart failure or chronic kidney disease.;Long Term: Maintenance of blood pressure at goal levels.    Lipids Yes    Intervention Provide education and support for participant on nutrition & aerobic/resistive exercise along with prescribed medications to achieve LDL '70mg'$ , HDL >$Remo'40mg'RLtnc$ .    Expected Outcomes Short Term: Participant states understanding of desired cholesterol values and is compliant with medications prescribed. Participant is following exercise prescription and nutrition guidelines.;Long Term: Cholesterol controlled with medications as prescribed, with individualized exercise RX and with personalized nutrition plan. Value goals: LDL < $Rem'70mg'tGwF$ , HDL > 40 mg.           Education:Diabetes - Individual verbal and written instruction to review signs/symptoms of diabetes, desired ranges of glucose level fasting, after meals and with exercise. Acknowledge that pre and post exercise glucose checks will be done for 3 sessions at entry of program.   Cardiac Rehab from 12/16/2019 in [facility] Cardiac and Pulmonary Rehab  Date 12/10/19  Educator [place]  Instruction Review Code 1- Verbalizes Understanding      Education: Know Your Numbers and Risk Factors: -Group verbal  and written instruction about important numbers in your health.  Discussion of what are risk factors and how they play a role in the disease process.  Review of Cholesterol, Blood Pressure, Diabetes, and BMI and the role they play in your overall health.   Core Components/Risk Factors/Patient Goals Review:    Core Components/Risk Factors/Patient Goals at Discharge (Final Review):    ITP Comments:  ITP Comments    Row Name 12/04/19 1303 12/10/19 1553 12/16/19 1014 12/16/19 1148     ITP Comments Initial orientation completed. Diagnosis can be found in [facility] 8/4. EP orientation scheduled for 9/2 at 2:30 Completed 6MWT and gym orientation. Initial ITP created and sent for review to Dr. Emily Filbert, Medical Director. First full day of exercise!  Patient was oriented to gym and equipment including functions, settings, policies, and procedures.  Patient's individual exercise prescription and treatment plan were reviewed.  All starting workloads were established based on the results of the 6 minute walk test done at initial orientation visit.  The plan for exercise progression was also introduced and progression will be customized based on patient's performance and goals. 30 day review completed. ITP sent to Dr. Emily Filbert, Medical Director of Cardiac and Pulmonary Rehab. Continue with ITP unless changes are made by physician.           Comments: 30  day review

## 2019-12-16 NOTE — Progress Notes (Signed)
Daily Session Note  Patient Details  Name: Lance [name] MRN: [id number] Date of Birth: 08/16/1955 Referring Provider:     Cardiac Rehab from 12/10/2019 in [facility] Cardiac and Pulmonary Rehab  Referring Provider Delight Stare MD      Encounter Date: 12/16/2019  Check In:  Session Check In - 12/16/19 1006      Check-In   Supervising physician immediately available to respond to emergencies See telemetry face sheet for immediately available ER MD    Location [facility]-Cardiac & Pulmonary Rehab    Staff Present Renita Papa, RN BSN;Joseph Foy [place], [place], ACSM CEP, Exercise Physiologist;Melissa Caiola RDN, LDN    Virtual Visit No    Medication changes reported     No    Fall or balance concerns reported    No    Warm-up and Cool-down Performed on first and last piece of equipment    Resistance Training Performed Yes    VAD Patient? No    PAD/SET Patient? No      Pain Assessment   Currently in Pain? No/denies              Social History   Tobacco Use  Smoking Status Never Smoker  Smokeless Tobacco Never Used    Goals Met:  Independence with exercise equipment Exercise tolerated well No report of cardiac concerns or symptoms Strength training completed today  Goals Unmet:  Not Applicable  Comments: First full day of exercise!  Patient was oriented to gym and equipment including functions, settings, policies, and procedures.  Patient's individual exercise prescription and treatment plan were reviewed.  All starting workloads were established based on the results of the 6 minute walk test done at initial orientation visit.  The plan for exercise progression was also introduced and progression will be customized based on patient's performance and goals.    Dr. Emily Filbert is Medical Director for [place] and [facility].

## 2019-12-18 ENCOUNTER — Other Ambulatory Visit: Payer: Self-pay

## 2019-12-18 ENCOUNTER — Encounter: Payer: Medicare Other | Admitting: *Deleted

## 2019-12-18 DIAGNOSIS — I214 Non-ST elevation (NSTEMI) myocardial infarction: Secondary | ICD-10-CM | POA: Diagnosis not present

## 2019-12-18 DIAGNOSIS — Z955 Presence of coronary angioplasty implant and graft: Secondary | ICD-10-CM

## 2019-12-18 LAB — GLUCOSE, CAPILLARY
Glucose-Capillary: 201 mg/dL — ABNORMAL HIGH (ref 70–99)
Glucose-Capillary: 164 mg/dL — ABNORMAL HIGH (ref 70–99)

## 2019-12-18 NOTE — Progress Notes (Signed)
Daily Session Note  Patient Details  Name: Lance [name] MRN: [id number] Date of Birth: Oct 30, 1955 Referring Provider:     Cardiac Rehab from 12/10/2019 in [facility] Cardiac and Pulmonary Rehab  Referring Provider Delight Stare MD      Encounter Date: 12/18/2019  Check In:  Session Check In - 12/18/19 1009      Check-In   Supervising physician immediately available to respond to emergencies See telemetry face sheet for immediately available ER MD    Location [facility]-Cardiac & Pulmonary Rehab    Staff Present Renita Papa, RN BSN;Joseph [address] [place], [place], RCEP, CCRP, CCET    Virtual Visit No    Medication changes reported     No    Fall or balance concerns reported    No    Warm-up and Cool-down Performed on first and last piece of equipment    Resistance Training Performed Yes    VAD Patient? No    PAD/SET Patient? No      Pain Assessment   Currently in Pain? No/denies              Social History   Tobacco Use  Smoking Status Never Smoker  Smokeless Tobacco Never Used    Goals Met:  Independence with exercise equipment Exercise tolerated well No report of cardiac concerns or symptoms Strength training completed today  Goals Unmet:  Not Applicable  Comments: Pt able to follow exercise prescription today without complaint.  Will continue to monitor for progression.    Dr. Emily Filbert is Medical Director for [place] and [facility].

## 2019-12-21 ENCOUNTER — Encounter: Payer: Medicare Other | Admitting: *Deleted

## 2019-12-21 ENCOUNTER — Other Ambulatory Visit: Payer: Self-pay

## 2019-12-21 DIAGNOSIS — I214 Non-ST elevation (NSTEMI) myocardial infarction: Secondary | ICD-10-CM | POA: Diagnosis not present

## 2019-12-21 DIAGNOSIS — Z955 Presence of coronary angioplasty implant and graft: Secondary | ICD-10-CM

## 2019-12-21 LAB — GLUCOSE, CAPILLARY
Glucose-Capillary: 210 mg/dL — ABNORMAL HIGH (ref 70–99)
Glucose-Capillary: 142 mg/dL — ABNORMAL HIGH (ref 70–99)

## 2019-12-21 NOTE — Progress Notes (Signed)
Daily Session Note  Patient Details  Name: Lance [name] MRN: [id number] Date of Birth: 1955/10/25 Referring Provider:     Cardiac Rehab from 12/10/2019 in [facility] Cardiac and Pulmonary Rehab  Referring Provider Delight Stare MD      Encounter Date: 12/21/2019  Check In:  Session Check In - 12/21/19 1022      Check-In   Supervising physician immediately available to respond to emergencies See telemetry face sheet for immediately available ER MD    Location [facility]-Cardiac & Pulmonary Rehab    Staff Present Renita Papa, RN BSN;Joseph Foy [place], [place], ACSM CEP, Exercise Physiologist;Kelly Amedeo Plenty, BS, ACSM CEP, Exercise Physiologist    Virtual Visit No    Medication changes reported     No    Fall or balance concerns reported    No    Warm-up and Cool-down Performed on first and last piece of equipment    Resistance Training Performed Yes    VAD Patient? No    PAD/SET Patient? No      Pain Assessment   Currently in Pain? No/denies              Social History   Tobacco Use  Smoking Status Never Smoker  Smokeless Tobacco Never Used    Goals Met:  Independence with exercise equipment Exercise tolerated well No report of cardiac concerns or symptoms Strength training completed today  Goals Unmet:  Not Applicable  Comments: Pt able to follow exercise prescription today without complaint.  Will continue to monitor for progression.    Dr. Emily Filbert is Medical Director for [place] and [facility].

## 2019-12-23 ENCOUNTER — Encounter: Payer: Medicare Other | Admitting: *Deleted

## 2019-12-23 ENCOUNTER — Other Ambulatory Visit: Payer: Self-pay

## 2019-12-23 DIAGNOSIS — I214 Non-ST elevation (NSTEMI) myocardial infarction: Secondary | ICD-10-CM

## 2019-12-23 DIAGNOSIS — Z955 Presence of coronary angioplasty implant and graft: Secondary | ICD-10-CM

## 2019-12-23 NOTE — Progress Notes (Signed)
Daily Session Note  Patient Details  Name: CARROL BONDAR MRN: [id number] Date of Birth: 12/04/1955 Referring Provider:     Cardiac Rehab from 12/10/2019 in [facility] Cardiac and Pulmonary Rehab  Referring Provider Delight Stare MD      Encounter Date: 12/23/2019  Check In:  Session Check In - 12/23/19 1006      Check-In   Supervising physician immediately available to respond to emergencies See telemetry face sheet for immediately available ER MD    Location [facility]-Cardiac & Pulmonary Rehab    Staff Present Nada Maclachlan, BA, ACSM CEP, Exercise Physiologist;Joseph Christain [place], RN BSN    Virtual Visit No    Medication changes reported     No    Fall or balance concerns reported    No    Warm-up and Cool-down Performed on first and last piece of equipment    Resistance Training Performed Yes    VAD Patient? No    PAD/SET Patient? No      Pain Assessment   Currently in Pain? No/denies              Social History   Tobacco Use  Smoking Status Never Smoker  Smokeless Tobacco Never Used    Goals Met:  Independence with exercise equipment Exercise tolerated well No report of cardiac concerns or symptoms Strength training completed today  Goals Unmet:  Not Applicable  Comments: Pt able to follow exercise prescription today without complaint.  Will continue to monitor for progression.    Dr. Emily Filbert is Medical Director for [place] and [facility].

## 2019-12-25 ENCOUNTER — Encounter: Payer: Medicare Other | Admitting: *Deleted

## 2019-12-25 ENCOUNTER — Other Ambulatory Visit: Payer: Self-pay

## 2019-12-25 DIAGNOSIS — Z955 Presence of coronary angioplasty implant and graft: Secondary | ICD-10-CM

## 2019-12-25 DIAGNOSIS — I214 Non-ST elevation (NSTEMI) myocardial infarction: Secondary | ICD-10-CM | POA: Diagnosis not present

## 2019-12-25 NOTE — Progress Notes (Signed)
Daily Session Note  Patient Details  Name: Lance [name] MRN: [id number] Date of Birth: [birth year] Referring Provider:     Cardiac Rehab from 12/10/2019 in [facility] Cardiac and Pulmonary Rehab  Referring Provider Delight Stare MD      Encounter Date: 12/25/2019  Check In:  Session Check In - 12/25/19 1008      Check-In   Supervising physician immediately available to respond to emergencies See telemetry face sheet for immediately available ER MD    Location [facility]-Cardiac & Pulmonary Rehab    Staff Present Renita Papa, RN BSN;Joseph [address] [place], [place], [place], CCRP, CCET    Virtual Visit No    Medication changes reported     No    Fall or balance concerns reported    No    Warm-up and Cool-down Performed on first and last piece of equipment    Resistance Training Performed Yes    VAD Patient? No    PAD/SET Patient? No      Pain Assessment   Currently in Pain? No/denies             Exercise Prescription Changes - 12/25/19 1000      Home Exercise Plan   Plans to continue exercise at Home (comment)   home gym and equipment   Frequency Add 2 additional days to program exercise sessions.    Initial Home Exercises Provided 12/25/19           Social History   Tobacco Use  Smoking Status Never Smoker  Smokeless Tobacco Never Used    Goals Met:  Independence with exercise equipment Exercise tolerated well No report of cardiac concerns or symptoms Strength training completed today  Goals Unmet:  Not Applicable  Comments: Pt able to follow exercise prescription today without complaint.  Will continue to monitor for progression.    Dr. Emily Filbert is Medical Director for [place] and [facility].

## 2019-12-28 ENCOUNTER — Other Ambulatory Visit: Payer: Self-pay

## 2019-12-28 ENCOUNTER — Encounter: Payer: Medicare Other | Admitting: *Deleted

## 2019-12-28 DIAGNOSIS — I214 Non-ST elevation (NSTEMI) myocardial infarction: Secondary | ICD-10-CM

## 2019-12-28 DIAGNOSIS — Z955 Presence of coronary angioplasty implant and graft: Secondary | ICD-10-CM

## 2019-12-28 NOTE — Progress Notes (Signed)
Daily Session Note  Patient Details  Name: Lance [name] MRN: [id number] Date of Birth: 1955/10/22 Referring Provider:     Cardiac Rehab from 12/10/2019 in [facility] Cardiac and Pulmonary Rehab  Referring Provider Delight Stare MD      Encounter Date: 12/28/2019  Check In:  Session Check In - 12/28/19 1042      Check-In   Supervising physician immediately available to respond to emergencies See telemetry face sheet for immediately available ER MD    Location [facility]-Cardiac & Pulmonary Rehab    Staff Present Earlean Shawl, BS, ACSM CEP, Exercise Physiologist;Tenna Lacko Sherryll Burger, RN BSN;Joseph Hood RCP,RRT,BSRT    Virtual Visit No    Medication changes reported     No    Fall or balance concerns reported    No    Warm-up and Cool-down Performed on first and last piece of equipment    Resistance Training Performed Yes    VAD Patient? No    PAD/SET Patient? No      Pain Assessment   Currently in Pain? No/denies              Social History   Tobacco Use  Smoking Status Never Smoker  Smokeless Tobacco Never Used    Goals Met:  Independence with exercise equipment Exercise tolerated well No report of cardiac concerns or symptoms Strength training completed today  Goals Unmet:  Not Applicable  Comments: Pt able to follow exercise prescription today without complaint.  Will continue to monitor for progression.    Dr. Emily Filbert is Medical Director for [place] and [facility].

## 2019-12-30 ENCOUNTER — Other Ambulatory Visit: Payer: Self-pay

## 2019-12-30 ENCOUNTER — Encounter: Payer: Medicare Other | Admitting: *Deleted

## 2019-12-30 DIAGNOSIS — Z955 Presence of coronary angioplasty implant and graft: Secondary | ICD-10-CM

## 2019-12-30 DIAGNOSIS — I214 Non-ST elevation (NSTEMI) myocardial infarction: Secondary | ICD-10-CM

## 2019-12-30 NOTE — Progress Notes (Signed)
Daily Session Note  Patient Details  Name: Lance [name] MRN: [id number] Date of Birth: 1955-07-13 Referring Provider:     Cardiac Rehab from 12/10/2019 in [facility] Cardiac and Pulmonary Rehab  Referring Provider Delight Stare MD      Encounter Date: 12/30/2019  Check In:  Session Check In - 12/30/19 1000      Check-In   Supervising physician immediately available to respond to emergencies See telemetry face sheet for immediately available ER MD    Location [facility]-Cardiac & Pulmonary Rehab    Staff Present Darel Hong, RN BSN;Joseph Foy [place], [place], ACSM CEP, Exercise Physiologist;Melissa Caiola RDN, LDN    Virtual Visit No    Medication changes reported     No    Fall or balance concerns reported    No    Warm-up and Cool-down Performed on first and last piece of equipment    Resistance Training Performed Yes    VAD Patient? No    PAD/SET Patient? No      Pain Assessment   Currently in Pain? No/denies              Social History   Tobacco Use  Smoking Status Never Smoker  Smokeless Tobacco Never Used    Goals Met:  Independence with exercise equipment Exercise tolerated well No report of cardiac concerns or symptoms Strength training completed today  Goals Unmet:  Not Applicable  Comments: Pt able to follow exercise prescription today without complaint.  Will continue to monitor for progression.    Dr. Emily Filbert is Medical Director for [place] and [facility].

## 2020-01-01 ENCOUNTER — Other Ambulatory Visit: Payer: Self-pay

## 2020-01-01 ENCOUNTER — Encounter: Payer: Medicare Other | Admitting: *Deleted

## 2020-01-01 DIAGNOSIS — I214 Non-ST elevation (NSTEMI) myocardial infarction: Secondary | ICD-10-CM

## 2020-01-01 DIAGNOSIS — Z955 Presence of coronary angioplasty implant and graft: Secondary | ICD-10-CM

## 2020-01-01 NOTE — Progress Notes (Signed)
Daily Session Note  Patient Details  Name: Lance [name] MRN: [id number] Date of Birth: [birth year] Referring Provider:     Cardiac Rehab from 12/10/2019 in [facility] Cardiac and Pulmonary Rehab  Referring Provider Delight Stare MD      Encounter Date: 01/01/2020  Check In:  Session Check In - 01/01/20 1110      Check-In   Supervising physician immediately available to respond to emergencies See telemetry face sheet for immediately available ER MD    Location [facility]-Cardiac & Pulmonary Rehab    Staff Present Heath Lark, RN, BSN, CCRP;Jessica [place], MA, RCEP, CCRP, CCET;Melissa Bendena RDN, LDN    Virtual Visit No    Medication changes reported     No    Fall or balance concerns reported    No    Warm-up and Cool-down Performed on first and last piece of equipment    Resistance Training Performed Yes    VAD Patient? No    PAD/SET Patient? No      Pain Assessment   Currently in Pain? No/denies              Social History   Tobacco Use  Smoking Status Never Smoker  Smokeless Tobacco Never Used    Goals Met:  Independence with exercise equipment Exercise tolerated well No report of cardiac concerns or symptoms  Goals Unmet:  Not Applicable  Comments: Pt able to follow exercise prescription today without complaint.  Will continue to monitor for progression.    Dr. Emily Filbert is Medical Director for [place] and [facility].

## 2020-01-04 ENCOUNTER — Encounter: Payer: Medicare Other | Admitting: *Deleted

## 2020-01-04 ENCOUNTER — Other Ambulatory Visit: Payer: Self-pay

## 2020-01-04 DIAGNOSIS — I214 Non-ST elevation (NSTEMI) myocardial infarction: Secondary | ICD-10-CM

## 2020-01-04 DIAGNOSIS — Z955 Presence of coronary angioplasty implant and graft: Secondary | ICD-10-CM

## 2020-01-04 NOTE — Progress Notes (Signed)
Daily Session Note  Patient Details  Name: Lance [name] MRN: [id number] Date of Birth: 06/07/1955 Referring Provider:     Cardiac Rehab from 12/10/2019 in [facility] Cardiac and Pulmonary Rehab  Referring Provider Delight Stare MD      Encounter Date: 01/04/2020  Check In:  Session Check In - 01/04/20 1033      Check-In   Supervising physician immediately available to respond to emergencies See telemetry face sheet for immediately available ER MD    Location [facility]-Cardiac & Pulmonary Rehab    Staff Present Renita Papa, RN BSN;Joseph [address] [place], [place], ACSM CEP, Exercise Physiologist    Virtual Visit No    Medication changes reported     No    Fall or balance concerns reported    No    Warm-up and Cool-down Performed on first and last piece of equipment    Resistance Training Performed Yes    VAD Patient? No    PAD/SET Patient? No      Pain Assessment   Currently in Pain? No/denies              Social History   Tobacco Use  Smoking Status Never Smoker  Smokeless Tobacco Never Used    Goals Met:  Independence with exercise equipment Exercise tolerated well No report of cardiac concerns or symptoms Strength training completed today  Goals Unmet:  Not Applicable  Comments: Pt able to follow exercise prescription today without complaint.  Will continue to monitor for progression.    Dr. Emily Filbert is Medical Director for [place] and [facility].

## 2020-01-06 ENCOUNTER — Encounter: Payer: Medicare Other | Admitting: *Deleted

## 2020-01-06 ENCOUNTER — Other Ambulatory Visit: Payer: Self-pay

## 2020-01-06 DIAGNOSIS — Z955 Presence of coronary angioplasty implant and graft: Secondary | ICD-10-CM

## 2020-01-06 DIAGNOSIS — I214 Non-ST elevation (NSTEMI) myocardial infarction: Secondary | ICD-10-CM

## 2020-01-06 NOTE — Progress Notes (Signed)
Daily Session Note  Patient Details  Name: Lance [name] MRN: [id number] Date of Birth: 1955/07/28 Referring Provider:     Cardiac Rehab from 12/10/2019 in [facility] Cardiac and Pulmonary Rehab  Referring Provider Delight Stare MD      Encounter Date: 01/06/2020  Check In:  Session Check In - 01/06/20 1246      Check-In   Location [facility]-Cardiac & Pulmonary Rehab    Staff Present Coralie Keens, MS Exercise Physiologist;Amanda Oletta Darter, BA, ACSM CEP, Exercise Physiologist;Eusevio Schriver, RN, BSN, CCRP    Virtual Visit No    Medication changes reported     No    Fall or balance concerns reported    No    Warm-up and Cool-down Performed on first and last piece of equipment    Resistance Training Performed Yes    VAD Patient? No    PAD/SET Patient? No      Pain Assessment   Currently in Pain? No/denies              Social History   Tobacco Use  Smoking Status Never Smoker  Smokeless Tobacco Never Used    Goals Met:  Independence with exercise equipment Exercise tolerated well No report of cardiac concerns or symptoms  Goals Unmet:  Not Applicable  Comments: Pt able to follow exercise prescription today without complaint.  Will continue to monitor for progression.    Dr. Emily Filbert is Medical Director for [place] and [facility].

## 2020-01-07 MED ORDER — PRASUGREL 10 MG TABLET
ORAL_TABLET | Freq: Every day | ORAL | 5 refills | 30.00000 days | Status: CP
Start: 2020-01-07 — End: 2020-07-05

## 2020-01-13 ENCOUNTER — Encounter: Payer: Self-pay | Admitting: *Deleted

## 2020-01-13 DIAGNOSIS — I214 Non-ST elevation (NSTEMI) myocardial infarction: Secondary | ICD-10-CM

## 2020-01-13 DIAGNOSIS — Z955 Presence of coronary angioplasty implant and graft: Secondary | ICD-10-CM

## 2020-01-13 NOTE — Progress Notes (Signed)
Cardiac Individual Treatment Plan  Patient Details  Name: Lance [name] MRN: [id number] Date of Birth: [birth year] Referring Provider:     Cardiac Rehab from 12/10/2019 in [facility] Cardiac and Pulmonary Rehab  Referring Provider Delight Stare MD      Initial Encounter Date:    Cardiac Rehab from 12/10/2019 in [facility] Cardiac and Pulmonary Rehab  Date 12/10/19      Visit Diagnosis: NSTEMI (non-ST elevated myocardial infarction) [facility])  Status post coronary artery stent placement  Patient's Home Medications on Admission:  Current Outpatient Medications:  .  Accu-Chek FastClix Lancets MISC, Apply topically., Disp: , Rfl:  .  ACCU-CHEK GUIDE test strip, , Disp: , Rfl:  .  amLODipine (NORVASC) 5 MG tablet, Take 5 mg by mouth daily.   (Patient not taking: Reported on 12/04/2019), Disp: , Rfl:  .  Blood Glucose Monitoring Suppl (FIFTY50 GLUCOSE METER 2.0) w/Device KIT, Disp. blood glucose meter kit preferred by patient's insurance. Dx: Diabetes, E11.9, Disp: , Rfl:  .  LANTUS SOLOSTAR 100 UNIT/ML Solostar Pen, Inject into the skin., Disp: , Rfl:  .  linagliptin (TRADJENTA) 5 MG TABS tablet, Take 5 mg by mouth daily. (Patient not taking: Reported on 12/04/2019), Disp: , Rfl:  .  lisinopril (ZESTRIL) 40 MG tablet, Take 40 mg by mouth daily., Disp: , Rfl:  .  lisinopril-hydrochlorothiazide (PRINZIDE,ZESTORETIC) 20-12.5 MG per tablet, Take 1 tablet by mouth daily.   (Patient not taking: Reported on 12/04/2019), Disp: , Rfl:  .  metFORMIN (GLUCOPHAGE-XR) 500 MG 24 hr tablet, Take 1,000 mg by mouth 2 (two) times daily. , Disp: , Rfl:  .  metoprolol succinate (TOPROL-XL) 50 MG 24 hr tablet, Take 50 mg by mouth daily., Disp: , Rfl:  .  Multiple Minerals (CALCIUM/MAGNESIUM/ZINC PO), Take by mouth daily. (Patient not taking: Reported on 12/04/2019), Disp: , Rfl:  .  nitroGLYCERIN (NITROSTAT) 0.4 MG SL tablet, Place under the tongue., Disp: , Rfl:  .  pioglitazone (ACTOS) 30 MG tablet, Take 30 mg by mouth daily.  (Patient not taking: Reported on 12/04/2019), Disp: , Rfl:  .  prasugrel (EFFIENT) 10 MG TABS tablet, Take 10 mg by mouth daily., Disp: , Rfl:  .  pravastatin (PRAVACHOL) 40 MG tablet, Take 40 mg by mouth daily., Disp: , Rfl:  .  TRUEPLUS 5-BEVEL PEN NEEDLES 31G X 6 MM MISC, , Disp: , Rfl:   Past Medical History: Past Medical History:  Diagnosis Date  . Anxiety   . Arthritis    knees  . Complication of anesthesia 2009   during shoulder block-ht rate dropped-had injection-did do surgery-  . Depression   . Diabetes mellitus   . Hyperlipidemia   . Hypertension   . Trochanteric bursitis of right hip     Tobacco Use: Social History   Tobacco Use  Smoking Status Never Smoker  Smokeless Tobacco Never Used    Labs: Recent Review Flowsheet Data    Labs for ITP Cardiac and Pulmonary Rehab Latest Ref Rng & Units 09/22/2007 06/09/2012   Cholestrol 0 - 200 mg/dL - 155   LDLCALC 0 - 100 mg/dL - 95   HDL 40 - 60 mg/dL - 34(L)   Trlycerides 0 - 200 mg/dL - 128   Hemoglobin A1c 4.2 - 6.3 % - 9.1(H)   TCO2 - 21 -       Exercise Target Goals: Exercise Program Goal: Individual exercise prescription set using results from initial 6 min walk test and THRR while considering  patient's activity barriers  and safety.   Exercise Prescription Goal: Initial exercise prescription builds to 30-45 minutes a day of aerobic activity, 2-3 days per week.  Home exercise guidelines will be given to patient during program as part of exercise prescription that the participant will acknowledge.   Education: Aerobic Exercise & Resistance Training: - Gives group verbal and written instruction on the various components of exercise. Focuses on aerobic and resistive training programs and the benefits of this training and how to safely progress through these programs..   Education: Exercise & Equipment Safety: - Individual verbal instruction and demonstration of equipment use and safety with use of the  equipment.   Cardiac Rehab from 01/06/2020 in [facility] Cardiac and Pulmonary Rehab  Date 12/10/19  Educator Lebam  Instruction Review Code 1- Verbalizes Understanding      Education: Exercise Physiology & General Exercise Guidelines: - Group verbal and written instruction with models to review the exercise physiology of the cardiovascular system and associated critical values. Provides general exercise guidelines with specific guidelines to those with heart or lung disease.    Education: Flexibility, Balance, Mind/Body Relaxation: Provides group verbal/written instruction on the benefits of flexibility and balance training, including mind/body exercise modes such as yoga, pilates and tai chi.  Demonstration and skill practice provided.   Activity Barriers & Risk Stratification:  Activity Barriers & Cardiac Risk Stratification - 12/10/19 1609      Activity Barriers & Cardiac Risk Stratification   Activity Barriers Joint Problems;Left Knee Replacement;Right Knee Replacement    Cardiac Risk Stratification Moderate           6 Minute Walk:  6 Minute Walk    Row Name 12/10/19 1614         6 Minute Walk   Phase Initial     Distance 1137 feet     Walk Time 6 minutes     # of Rest Breaks 0     MPH 2.15     METS 2.45     RPE 7     Perceived Dyspnea  0     VO2 Peak 8.6     Symptoms No     Resting HR 75 bpm     Resting BP 132/70     Resting Oxygen Saturation  96 %     Exercise Oxygen Saturation  during 6 min walk 98 %     Max Ex. HR 93 bpm     Max Ex. BP 146/74     2 Minute Post BP 116/72            Oxygen Initial Assessment:   Oxygen Re-Evaluation:   Oxygen Discharge (Final Oxygen Re-Evaluation):   Initial Exercise Prescription:  Initial Exercise Prescription - 12/10/19 1600      Date of Initial Exercise RX and Referring Provider   Date 12/10/19    Referring Provider Delight Stare MD      Treadmill   MPH 1.9    Grade 0.5    Minutes 15    METs 2.59       NuStep   Level 2    SPM 80    Minutes 15    METs 2.4      REL-XR   Level 2    Speed 50    Minutes 15    METs 2.4      Biostep-RELP   Level 2    SPM 50    Minutes 15    METs 2.4      Prescription  Details   Frequency (times per week) 3    Duration Progress to 30 minutes of continuous aerobic without signs/symptoms of physical distress      Intensity   THRR 40-80% of Max Heartrate 108-142    Ratings of Perceived Exertion 11-13    Perceived Dyspnea 0-4      Progression   Progression Continue to progress workloads to maintain intensity without signs/symptoms of physical distress.      Resistance Training   Training Prescription Yes    Weight 3    Reps 10-15           Perform Capillary Blood Glucose checks as needed.  Exercise Prescription Changes:  Exercise Prescription Changes    Row Name 12/10/19 1600 12/25/19 1000 12/30/19 1200 01/12/20 1400       Response to Exercise   Blood Pressure (Admit) 132/70 -- 136/80 138/78    Blood Pressure (Exercise) 146/74 -- 148/70 144/74    Blood Pressure (Exit) 116/72 -- 128/76 124/72    Heart Rate (Admit) 75 bpm -- 92 bpm 75 bpm    Heart Rate (Exercise) 93 bpm -- 97 bpm 110 bpm    Heart Rate (Exit) 75 bpm -- 69 bpm 78 bpm    Oxygen Saturation (Admit) 96 % -- -- --    Oxygen Saturation (Exercise) 98 % -- -- --    Oxygen Saturation (Exit) 96 % -- -- --    Rating of Perceived Exertion (Exercise) 7 -- 15 15    Perceived Dyspnea (Exercise) 0 -- -- --    Symptoms none -- none none    Comments walk test results -- -- --    Duration Progress to 30 minutes of  aerobic without signs/symptoms of physical distress -- Progress to 30 minutes of  aerobic without signs/symptoms of physical distress Continue with 30 min of aerobic exercise without signs/symptoms of physical distress.    Intensity -- -- THRR unchanged THRR unchanged      Progression   Progression -- -- Continue to progress workloads to maintain intensity without  signs/symptoms of physical distress. Continue to progress workloads to maintain intensity without signs/symptoms of physical distress.    Average METs -- -- -- 3.34      Resistance Training   Training Prescription -- -- Yes Yes    Weight -- -- 8 lb 8 lb    Reps -- -- 10-15 10-15      Interval Training   Interval Training -- -- -- No      Treadmill   MPH -- -- 2.1 2.3    Grade -- -- 1.5 1.5    Minutes -- -- 15 15    METs -- -- 3.04 3.24      NuStep   Level -- -- 4 4    SPM -- -- 80 --    Minutes -- -- 15 15    METs -- -- 5 4.7      REL-XR   Level -- -- -- 2    Minutes -- -- -- 15    METs -- -- -- 2.1      Home Exercise Plan   Plans to continue exercise at -- Home (comment)  home gym and equipment -- --    Frequency -- Add 2 additional days to program exercise sessions. -- --    Initial Home Exercises Provided -- 12/25/19 -- --           Exercise Comments:   Exercise Goals and Review:  Exercise Goals    Row Name 12/10/19 1613             Exercise Goals   Increase Physical Activity Yes       Intervention Provide advice, education, support and counseling about physical activity/exercise needs.;Develop an individualized exercise prescription for aerobic and resistive training based on initial evaluation findings, risk stratification, comorbidities and participant's personal goals.       Expected Outcomes Short Term: Attend rehab on a regular basis to increase amount of physical activity.;Long Term: Add in home exercise to make exercise part of routine and to increase amount of physical activity.;Long Term: Exercising regularly at least 3-5 days a week.       Increase Strength and Stamina Yes       Intervention Provide advice, education, support and counseling about physical activity/exercise needs.;Develop an individualized exercise prescription for aerobic and resistive training based on initial evaluation findings, risk stratification, comorbidities and participant's  personal goals.       Expected Outcomes Short Term: Increase workloads from initial exercise prescription for resistance, speed, and METs.;Short Term: Perform resistance training exercises routinely during rehab and add in resistance training at home;Long Term: Improve cardiorespiratory fitness, muscular endurance and strength as measured by increased METs and functional capacity (6MWT)       Able to understand and use rate of perceived exertion (RPE) scale Yes       Intervention Provide education and explanation on how to use RPE scale       Expected Outcomes Short Term: Able to use RPE daily in rehab to express subjective intensity level;Long Term:  Able to use RPE to guide intensity level when exercising independently       Able to understand and use Dyspnea scale Yes       Intervention Provide education and explanation on how to use Dyspnea scale       Expected Outcomes Short Term: Able to use Dyspnea scale daily in rehab to express subjective sense of shortness of breath during exertion;Long Term: Able to use Dyspnea scale to guide intensity level when exercising independently       Knowledge and understanding of Target Heart Rate Range (THRR) Yes       Intervention Provide education and explanation of THRR including how the numbers were predicted and where they are located for reference       Expected Outcomes Short Term: Able to state/look up THRR;Short Term: Able to use daily as guideline for intensity in rehab;Long Term: Able to use THRR to govern intensity when exercising independently       Able to check pulse independently Yes       Intervention Provide education and demonstration on how to check pulse in carotid and radial arteries.;Review the importance of being able to check your own pulse for safety during independent exercise       Expected Outcomes Short Term: Able to explain why pulse checking is important during independent exercise;Long Term: Able to check pulse independently and  accurately       Understanding of Exercise Prescription Yes       Intervention Provide education, explanation, and written materials on patient's individual exercise prescription       Expected Outcomes Short Term: Able to explain program exercise prescription;Long Term: Able to explain home exercise prescription to exercise independently              Exercise Goals Re-Evaluation :  Exercise Goals Re-Evaluation    Row Name 12/16/19  1015 12/25/19 0956 12/30/19 1241 12/30/19 1242 01/12/20 1427     Exercise Goal Re-Evaluation   Exercise Goals Review Increase Physical Activity;Able to understand and use rate of perceived exertion (RPE) scale;Understanding of Exercise Prescription;Knowledge and understanding of Target Heart Rate Range (THRR);Increase Strength and Stamina;Able to check pulse independently Increase Physical Activity;Able to understand and use rate of perceived exertion (RPE) scale;Understanding of Exercise Prescription;Knowledge and understanding of Target Heart Rate Range (THRR);Increase Strength and Stamina;Able to check pulse independently Increase Physical Activity;Able to understand and use rate of perceived exertion (RPE) scale;Understanding of Exercise Prescription;Knowledge and understanding of Target Heart Rate Range (THRR);Increase Strength and Stamina;Able to check pulse independently -- Increase Physical Activity;Increase Strength and Stamina;Improve claudication pain tolerance and improve walking ability   Comments Reviewed RPE and dyspnea scales, THR and program prescription with pt today.  Pt voiced understanding and was given a copy of goals to take home. Lance [name] is off to a good start in rehab.  He has already started to get back to his exercise routine. Reviewed home exercise with pt today.  Pt plans to use his home gym and equipment for exercise.  Reviewed THR, pulse, RPE, sign and symptoms, pulse oximetery and when to call 911 or MD.  Also discussed weather considerations  and indoor options.  Pt voiced understanding. Lance [name] is progressing well.  He has increased to 8 lb for strength work. He has also increased load on machines. Lance [name] has been doing well in rehab.  He is out this week with a COVID-19 exposure.  He is up to 2.3 mph on the treadmill.  We will continue to monitor his progress.   Expected Outcomes Short: Use RPE daily to regulate intensity. Long: Follow program prescription in THR. Short: Continue to add in exercise at home Long: Continue to follow program prescription. -- Short: exercise consistently in class and home Long:  increase stamina overall Short: Return to regular attendance once cleared.  Long; Continue to improve stamina          Discharge Exercise Prescription (Final Exercise Prescription Changes):  Exercise Prescription Changes - 01/12/20 1400      Response to Exercise   Blood Pressure (Admit) 138/78    Blood Pressure (Exercise) 144/74    Blood Pressure (Exit) 124/72    Heart Rate (Admit) 75 bpm    Heart Rate (Exercise) 110 bpm    Heart Rate (Exit) 78 bpm    Rating of Perceived Exertion (Exercise) 15    Symptoms none    Duration Continue with 30 min of aerobic exercise without signs/symptoms of physical distress.    Intensity THRR unchanged      Progression   Progression Continue to progress workloads to maintain intensity without signs/symptoms of physical distress.    Average METs 3.34      Resistance Training   Training Prescription Yes    Weight 8 lb    Reps 10-15      Interval Training   Interval Training No      Treadmill   MPH 2.3    Grade 1.5    Minutes 15    METs 3.24      NuStep   Level 4    Minutes 15    METs 4.7      REL-XR   Level 2    Minutes 15    METs 2.1           Nutrition:  Target Goals: Understanding of nutrition guidelines, daily intake of sodium '1500mg'$ ,  cholesterol '200mg'$ , calories 30% from fat and 7% or less from saturated fats, daily to have 5 or more servings of fruits and  vegetables.  Education: Controlling Sodium/Reading Food Labels -Group verbal and written material supporting the discussion of sodium use in heart healthy nutrition. Review and explanation with models, verbal and written materials for utilization of the food label.   Education: General Nutrition Guidelines/Fats and Fiber: -Group instruction provided by verbal, written material, models and posters to present the general guidelines for heart healthy nutrition. Gives an explanation and review of dietary fats and fiber.   Cardiac Rehab from 01/06/2020 in [facility] Cardiac and Pulmonary Rehab  Date 12/16/19  Educator Physician [facility]  Instruction Review Code 1- Verbalizes Understanding      Biometrics:  Pre Biometrics - 12/10/19 1615      Pre Biometrics   Height 5' 8.8" (1.748 m)    Weight 263 lb 6.4 oz (119.5 kg)    BMI (Calculated) 39.1    Single Leg Stand 10 seconds            Nutrition Therapy Plan and Nutrition Goals:  Nutrition Therapy & Goals - 12/28/19 1057      Nutrition Therapy   Diet Heart healhty, Low Na, T2DM friendly    Protein (specify units) 95g    Fiber 30 grams    Whole Grain Foods 3 servings    Saturated Fats 12 max. grams    Fruits and Vegetables 5 servings/day    Sodium 1.5 grams      Personal Nutrition Goals   Nutrition Goal ST: Review paperwork, increase water, mindful eating (honor hunger) LT: Lose 30 pounds - has already lost 40 pounds    Comments B: bowl of cereal (cheerios and bran - 2% milk) and fruit or egg L: [place] or chicken sandwich (whole wheat) D: whatever his wife makes - vegetables, spaghetti, chicken, steak (1x.week), fish (fried - hasn't had recently). Discussed heart healthy and T2DM friendly eating as well as mindful eating as he reports being ravenous most of the time by the end of the day.      Intervention Plan   Intervention Prescribe, educate and counsel regarding individualized specific dietary modifications aiming towards targeted core components  such as weight, hypertension, lipid management, diabetes, heart failure and other comorbidities.;Nutrition handout(s) given to patient.    Expected Outcomes Short Term Goal: Understand basic principles of dietary content, such as calories, fat, sodium, cholesterol and nutrients.;Short Term Goal: A plan has been developed with personal nutrition goals set during dietitian appointment.;Long Term Goal: Adherence to prescribed nutrition plan.           Nutrition Assessments:  Nutrition Assessments - 12/10/19 1559      MEDFICTS Scores   Pre Score 37           MEDIFICTS Score Key:          ?70 Need to make dietary changes          40-70 Heart Healthy Diet         ? 40 Therapeutic Level Cholesterol Diet  Nutrition Goals Re-Evaluation:  Nutrition Goals Re-Evaluation    Lance [name] Name 12/25/19 0954             Goals   Comment Meets with Lance [name] on 12/28/19              Nutrition Goals Discharge (Final Nutrition Goals Re-Evaluation):  Nutrition Goals Re-Evaluation - 12/25/19 0954      Goals   Comment Meets  with Lance [name] on 12/28/19           Psychosocial: Target Goals: Acknowledge presence or absence of significant depression and/or stress, maximize coping skills, provide positive support system. Participant is able to verbalize types and ability to use techniques and skills needed for reducing stress and depression.   Education: Depression - Provides group verbal and written instruction on the correlation between heart/lung disease and depressed mood, treatment options, and the stigmas associated with seeking treatment.   Education: Sleep Hygiene -Provides group verbal and written instruction about how sleep can affect your health.  Define sleep hygiene, discuss sleep cycles and impact of sleep habits. Review good sleep hygiene tips.     Education: Stress and Anxiety: - Provides group verbal and written instruction about the health risks of elevated stress and causes of high  stress.  Discuss the correlation between heart/lung disease and anxiety and treatment options. Review healthy ways to manage with stress and anxiety.    Initial Review & Psychosocial Screening:  Initial Psych Review & Screening - 12/04/19 1310      Initial Review   Current issues with None Identified      Family Dynamics   Good Support System? Yes      Barriers   Psychosocial barriers to participate in program There are no identifiable barriers or psychosocial needs.;The patient should benefit from training in stress management and relaxation.      Screening Interventions   Interventions Encouraged to exercise;To provide support and resources with identified psychosocial needs;Provide feedback about the scores to participant    Expected Outcomes Short Term goal: Utilizing psychosocial counselor, staff and physician to assist with identification of specific Stressors or current issues interfering with healing process. Setting desired goal for each stressor or current issue identified.;Long Term Goal: Stressors or current issues are controlled or eliminated.;Short Term goal: Identification and review with participant of any Quality of Life or Depression concerns found by scoring the questionnaire.;Long Term goal: The participant improves quality of Life and PHQ9 Scores as seen by post scores and/or verbalization of changes           Quality of Life Scores:   Quality of Life - 12/10/19 1601      Quality of Life   Select Quality of Life      Quality of Life Scores   Health/Function Pre 28 %    Socioeconomic Pre 27 %    Psych/Spiritual Pre 28.29 %    Family Pre 30 %    GLOBAL Pre 28.18 %          Scores of 19 and below usually indicate a poorer quality of life in these areas.  A difference of  2-3 points is a clinically meaningful difference.  A difference of 2-3 points in the total score of the Quality of Life Index has been associated with significant improvement in overall  quality of life, self-image, physical symptoms, and general health in studies assessing change in quality of life.  PHQ-9: Recent Review Flowsheet Data    Depression screen [facility] 2/9 12/10/2019   Decreased Interest 0   Down, Depressed, Hopeless 0   PHQ - 2 Score 0   Altered sleeping 0   Tired, decreased energy 0   Change in appetite 0   Trouble concentrating 0   Moving slowly or fidgety/restless 0   Suicidal thoughts 0   PHQ-9 Score 0   Difficult doing work/chores Not difficult at all     Interpretation of Total  Score  Total Score Depression Severity:  1-4 = Minimal depression, 5-9 = Mild depression, 10-14 = Moderate depression, 15-19 = Moderately severe depression, 20-27 = Severe depression   Psychosocial Evaluation and Intervention:  Psychosocial Evaluation - 12/04/19 1331      Psychosocial Evaluation & Interventions   Comments Lance [name] reports doing well post MI. He is looking forward to learning what he can and can't do so he can get back to exercise. He has been on disability since 1996 due to both knees requiring multiple surgeries. He states things are going well for he and his wife, now that she can't fuss at him because he can't get all worked up after his MI.    Expected Outcomes Short: attend cardiac rehab for educaion and exercise. Long: develop positive self care habits.    Continue Psychosocial Services  Follow up required by staff           Psychosocial Re-Evaluation:  Psychosocial Re-Evaluation    [place] Name 12/25/19 [phone]             Psychosocial Re-Evaluation   Current issues with Current Stress Concerns;Current Sleep Concerns       Comments Lance [name] is off to a good start in rehab.  He is awaiting his hospital bill and a little worried about the charges.  Overall, he does not have any majors stressors.  He has a hard time getting to sleep but once out, he sleeps well.       Expected Outcomes Short: Continue to work on sleep Long: Continue to stay positive.        Interventions Encouraged to attend Cardiac Rehabilitation for the exercise       Continue Psychosocial Services  Follow up required by staff         Initial Review   Source of Stress Concerns Financial              Psychosocial Discharge (Final Psychosocial Re-Evaluation):  Psychosocial Re-Evaluation - 12/25/19 0952      Psychosocial Re-Evaluation   Current issues with Current Stress Concerns;Current Sleep Concerns    Comments Lance [name] is off to a good start in rehab.  He is awaiting his hospital bill and a little worried about the charges.  Overall, he does not have any majors stressors.  He has a hard time getting to sleep but once out, he sleeps well.    Expected Outcomes Short: Continue to work on sleep Long: Continue to stay positive.    Interventions Encouraged to attend Cardiac Rehabilitation for the exercise    [facility]  Follow up required by staff      Initial Review   Source of Stress [employer]cerns Financial           Vocational Rehabilitation: Provide vocational rehab assistance to qualifying candidates.   Vocational Rehab Evaluation & Intervention:  Vocational Rehab - 12/04/19 1310      Initial Vocational Rehab Evaluation & Intervention   Assessment shows need for Vocational Rehabilitation No           Education: Education Goals: Education classes will be provided on a variety of topics geared toward better understanding of heart health and risk factor modification. Participant will state understanding/return demonstration of topics presented as noted by education test scores.  Learning Barriers/Preferences:  Learning Barriers/Preferences - 12/04/19 1310      Learning Barriers/Preferences   Learning Barriers None    Learning Preferences None  General Cardiac Education Topics:  AED/CPR: - Group verbal and written instruction with the use of models to demonstrate the basic use of the AED with the basic ABC's of  resuscitation.   Anatomy & Physiology of the Heart: - Group verbal and written instruction and models provide basic cardiac anatomy and physiology, with the coronary electrical and arterial systems. Review of Valvular disease and Heart Failure   Cardiac Procedures: - Group verbal and written instruction to review commonly prescribed medications for heart disease. Reviews the medication, class of the drug, and side effects. Includes the steps to properly store meds and maintain the prescription regimen. (beta blockers and nitrates)   Cardiac Medications I: - Group verbal and written instruction to review commonly prescribed medications for heart disease. Reviews the medication, class of the drug, and side effects. Includes the steps to properly store meds and maintain the prescription regimen.   Cardiac Rehab from 01/06/2020 in [facility] Cardiac and Pulmonary Rehab  Date 12/23/19  Educator [facility]  Instruction Review Code 1- Verbalizes Understanding      Cardiac Medications II: -Group verbal and written instruction to review commonly prescribed medications for heart disease. Reviews the medication, class of the drug, and side effects. (all other drug classes)    Go Sex-Intimacy & Heart Disease, Get SMART - Goal Setting: - Group verbal and written instruction through game format to discuss heart disease and the return to sexual intimacy. Provides group verbal and written material to discuss and apply goal setting through the application of the S.M.A.R.T. Method.   Other Matters of the Heart: - Provides group verbal, written materials and models to describe Stable Angina and Peripheral Artery. Includes description of the disease process and treatment options available to the cardiac patient.   Infection Prevention: - Provides verbal and written material to individual with discussion of infection control including proper hand washing and proper equipment cleaning during exercise session.    Cardiac Rehab from 01/06/2020 in [facility] Cardiac and Pulmonary Rehab  Date 12/10/19  Educator [place]  Instruction Review Code 1- Verbalizes Understanding      Falls Prevention: - Provides verbal and written material to individual with discussion of falls prevention and safety.   Cardiac Rehab from 01/06/2020 in [facility] Cardiac and Pulmonary Rehab  Date 12/10/19  Educator [place]  Instruction Review Code 1- Verbalizes Understanding      Other: -Provides group and verbal instruction on various topics (see comments)   Knowledge Questionnaire Score:  Knowledge Questionnaire Score - 12/10/19 1557      Knowledge Questionnaire Score   Pre Score 19/26: Heart Failure, Heart Rate, Exercise, Nutrition           Core Components/Risk Factors/Patient Goals at Admission:  Personal Goals and Risk Factors at Admission - 12/10/19 1616      Core Components/Risk Factors/Patient Goals on Admission    Weight Management Yes;Weight Loss    Intervention Weight Management: Develop a combined nutrition and exercise program designed to reach desired caloric intake, while maintaining appropriate intake of nutrient and fiber, sodium and fats, and appropriate energy expenditure required for the weight goal.;Weight Management: Provide education and appropriate resources to help participant work on and attain dietary goals.;Weight Management/Obesity: Establish reasonable short term and long term weight goals.    Admit Weight 263 lb 6.4 oz (119.5 kg)    Goal Weight: Short Term 258 lb (117 kg)    Goal Weight: Long Term 253 lb (114.8 kg)    Expected Outcomes Short Term: Continue to assess  and modify interventions until short term weight is achieved;Long Term: Adherence to nutrition and physical activity/exercise program aimed toward attainment of established weight goal;Weight Loss: Understanding of general recommendations for a balanced deficit meal plan, which promotes 1-2 lb weight loss per week and includes a negative energy  balance of [phone] kcal/d;Understanding recommendations for meals to include 15-35% energy as protein, 25-35% energy from fat, 35-60% energy from carbohydrates, less than $RemoveB'200mg'tciWnEad$  of dietary cholesterol, 20-35 gm of total fiber daily;Understanding of distribution of calorie intake throughout the day with the consumption of 4-5 meals/snacks    Diabetes Yes    Intervention Provide education about signs/symptoms and action to take for hypo/hyperglycemia.;Provide education about proper nutrition, including hydration, and aerobic/resistive exercise prescription along with prescribed medications to achieve blood glucose in normal ranges: Fasting glucose 65-99 mg/dL    Expected Outcomes Short Term: Participant verbalizes understanding of the signs/symptoms and immediate care of hyper/hypoglycemia, proper foot care and importance of medication, aerobic/resistive exercise and nutrition plan for blood glucose control.;Long Term: Attainment of HbA1C < 7%.    Hypertension Yes    Intervention Provide education on lifestyle modifcations including regular physical activity/exercise, weight management, moderate sodium restriction and increased consumption of fresh fruit, vegetables, and low fat dairy, alcohol moderation, and smoking cessation.;Monitor prescription use compliance.    Expected Outcomes Short Term: Continued assessment and intervention until BP is < 140/43mm HG in hypertensive participants. < 130/66mm HG in hypertensive participants with diabetes, heart failure or chronic kidney disease.;Long Term: Maintenance of blood pressure at goal levels.    Lipids Yes    Intervention Provide education and support for participant on nutrition & aerobic/resistive exercise along with prescribed medications to achieve LDL '70mg'$ , HDL >$Remo'40mg'bCwXR$ .    Expected Outcomes Short Term: Participant states understanding of desired cholesterol values and is compliant with medications prescribed. Participant is following exercise prescription  and nutrition guidelines.;Long Term: Cholesterol controlled with medications as prescribed, with individualized exercise RX and with personalized nutrition plan. Value goals: LDL < $Rem'70mg'WPiX$ , HDL > 40 mg.           Education:Diabetes - Individual verbal and written instruction to review signs/symptoms of diabetes, desired ranges of glucose level fasting, after meals and with exercise. Acknowledge that pre and post exercise glucose checks will be done for 3 sessions at entry of program.   Cardiac Rehab from 01/06/2020 in [facility] Cardiac and Pulmonary Rehab  Date 12/10/19  Educator [place]  Instruction Review Code 1- Verbalizes Understanding      Education: Know Your Numbers and Risk Factors: -Group verbal and written instruction about important numbers in your health.  Discussion of what are risk factors and how they play a role in the disease process.  Review of Cholesterol, Blood Pressure, Diabetes, and BMI and the role they play in your overall health.   Core Components/Risk Factors/Patient Goals Review:   Goals and Risk Factor Review    Row Name 12/25/19 0954             Core Components/Risk Factors/Patient Goals Review   Personal Goals Review Weight Management/Obesity;Diabetes;Hypertension;Lipids       Review Lance [name] is doing well in rehab.  His weight is coming down.  His blood sugars are getting better.  He was 114 mg/dl today.  He usually checks 2-3x a day.  His pressures have been good in class but he does not have a good cuff at home.       Expected Outcomes Short: Continue to work on weight loss Long:  Continue to monitor risk factors.              Core Components/Risk Factors/Patient Goals at Discharge (Final Review):   Goals and Risk Factor Review - 12/25/19 0954      Core Components/Risk Factors/Patient Goals Review   Personal Goals Review Weight Management/Obesity;Diabetes;Hypertension;Lipids    Review Lance [name] is doing well in rehab.  His weight is coming down.  His blood sugars  are getting better.  He was 114 mg/dl today.  He usually checks 2-3x a day.  His pressures have been good in class but he does not have a good cuff at home.    Expected Outcomes Short: Continue to work on weight loss Long: Continue to monitor risk factors.           ITP Comments:  ITP Comments    Row Name 12/04/19 1303 12/10/19 1553 12/16/19 1014 12/16/19 1148 01/12/20 1422   ITP Comments Initial orientation completed. Diagnosis can be found in [facility] 8/4. EP orientation scheduled for 9/2 at 2:30 Completed 6MWT and gym orientation. Initial ITP created and sent for review to Dr. Emily Filbert, Medical Director. First full day of exercise!  Patient was oriented to gym and equipment including functions, settings, policies, and procedures.  Patient's individual exercise prescription and treatment plan were reviewed.  All starting workloads were established based on the results of the 6 minute walk test done at initial orientation visit.  The plan for exercise progression was also introduced and progression will be customized based on patient's performance and goals. 30 day review completed. ITP sent to Dr. Emily Filbert, Medical Director of Cardiac and Pulmonary Rehab. Continue with ITP unless changes are made by physician. Dejion has been out due to a COVID-19 exposure.   [place] Name 01/13/20 0508           ITP Comments 30 Day review completed. Medical Director ITP review done, changes made as directed, and signed approval by Medical Director.              Comments:

## 2020-01-18 ENCOUNTER — Other Ambulatory Visit: Payer: Self-pay

## 2020-01-18 ENCOUNTER — Encounter: Payer: Medicare Other | Attending: Family Medicine | Admitting: *Deleted

## 2020-01-18 DIAGNOSIS — Z955 Presence of coronary angioplasty implant and graft: Secondary | ICD-10-CM | POA: Insufficient documentation

## 2020-01-18 DIAGNOSIS — I214 Non-ST elevation (NSTEMI) myocardial infarction: Secondary | ICD-10-CM | POA: Diagnosis present

## 2020-01-18 NOTE — Progress Notes (Signed)
Daily Session Note  Patient Details  Name: Lance [name] MRN: [id number] Date of Birth: 07/16/1955 Referring Provider:     Cardiac Rehab from 12/10/2019 in [facility] Cardiac and Pulmonary Rehab  Referring Provider Delight Stare MD      Encounter Date: 01/18/2020  Check In:  Session Check In - 01/18/20 1036      Check-In   Supervising physician immediately available to respond to emergencies See telemetry face sheet for immediately available ER MD    Location [facility]-Cardiac & Pulmonary Rehab    Staff Present Renita Papa, RN BSN;Joseph [address] [place], [place], ACSM CEP, Exercise Physiologist    Virtual Visit No    Medication changes reported     No    Fall or balance concerns reported    No    Warm-up and Cool-down Performed on first and last piece of equipment    Resistance Training Performed Yes    VAD Patient? No    PAD/SET Patient? No      Pain Assessment   Currently in Pain? No/denies              Social History   Tobacco Use  Smoking Status Never Smoker  Smokeless Tobacco Never Used    Goals Met:  Independence with exercise equipment Exercise tolerated well No report of cardiac concerns or symptoms Strength training completed today  Goals Unmet:  Not Applicable  Comments: Pt able to follow exercise prescription today without complaint.  Will continue to monitor for progression.    Dr. Emily Filbert is Medical Director for [place] and [facility].

## 2020-01-20 ENCOUNTER — Encounter: Payer: Medicare Other | Admitting: *Deleted

## 2020-01-20 ENCOUNTER — Other Ambulatory Visit: Payer: Self-pay

## 2020-01-20 DIAGNOSIS — I214 Non-ST elevation (NSTEMI) myocardial infarction: Secondary | ICD-10-CM

## 2020-01-20 DIAGNOSIS — Z955 Presence of coronary angioplasty implant and graft: Secondary | ICD-10-CM

## 2020-01-20 NOTE — Progress Notes (Signed)
Daily Session Note  Patient Details  Name: Lance [name] MRN: [id number] Date of Birth: December 08, 1955 Referring Provider:     Cardiac Rehab from 12/10/2019 in [facility] Cardiac and Pulmonary Rehab  Referring Provider Delight Stare MD      Encounter Date: 01/20/2020  Check In:  Session Check In - 01/20/20 1111      Check-In   Supervising physician immediately available to respond to emergencies See telemetry face sheet for immediately available ER MD    Location [facility]-Cardiac & Pulmonary Rehab    Staff Present Renita Papa, RN BSN;Joseph [address] [place], [place], [place], CCRP, CCET;Melissa [place] RDN, [place], [place], ACSM CEP, Exercise Physiologist    Virtual Visit No    Medication changes reported     No    Fall or balance concerns reported    No    Warm-up and Cool-down Performed on first and last piece of equipment    Resistance Training Performed Yes    VAD Patient? No    PAD/SET Patient? No      Pain Assessment   Currently in Pain? No/denies              Social History   Tobacco Use  Smoking Status Never Smoker  Smokeless Tobacco Never Used    Goals Met:  Independence with exercise equipment Exercise tolerated well No report of cardiac concerns or symptoms Strength training completed today  Goals Unmet:  Not Applicable  Comments: Pt able to follow exercise prescription today without complaint.  Will continue to monitor for progression.    Dr. Emily Filbert is Medical Director for [place] and [facility].

## 2020-01-22 ENCOUNTER — Encounter: Payer: Medicare Other | Admitting: *Deleted

## 2020-01-22 ENCOUNTER — Other Ambulatory Visit: Payer: Self-pay

## 2020-01-22 DIAGNOSIS — Z955 Presence of coronary angioplasty implant and graft: Secondary | ICD-10-CM

## 2020-01-22 DIAGNOSIS — I214 Non-ST elevation (NSTEMI) myocardial infarction: Secondary | ICD-10-CM | POA: Diagnosis not present

## 2020-01-22 NOTE — Progress Notes (Signed)
Daily Session Note  Patient Details  Name: Lance [name] MRN: [id number] Date of Birth: [birth year] Referring Provider:     Cardiac Rehab from 12/10/2019 in [facility] Cardiac and Pulmonary Rehab  Referring Provider Delight Stare MD      Encounter Date: 01/22/2020  Check In:  Session Check In - 01/22/20 1005      Check-In   Supervising physician immediately available to respond to emergencies See telemetry face sheet for immediately available ER MD    Location [facility]-Cardiac & Pulmonary Rehab    Staff Present Renita Papa, RN BSN;Joseph [address] [place], [place], RCEP, CCRP, CCET    Virtual Visit No    Medication changes reported     No    Fall or balance concerns reported    No    Warm-up and Cool-down Performed on first and last piece of equipment    Resistance Training Performed Yes    VAD Patient? No    PAD/SET Patient? No      Pain Assessment   Currently in Pain? No/denies              Social History   Tobacco Use  Smoking Status Never Smoker  Smokeless Tobacco Never Used    Goals Met:  Independence with exercise equipment Exercise tolerated well No report of cardiac concerns or symptoms Strength training completed today  Goals Unmet:  Not Applicable  Comments: Pt able to follow exercise prescription today without complaint.  Will continue to monitor for progression.    Dr. Emily Filbert is Medical Director for [place] and [facility].

## 2020-01-25 ENCOUNTER — Encounter: Payer: Medicare Other | Admitting: *Deleted

## 2020-01-25 ENCOUNTER — Other Ambulatory Visit: Payer: Self-pay

## 2020-01-25 DIAGNOSIS — I214 Non-ST elevation (NSTEMI) myocardial infarction: Secondary | ICD-10-CM | POA: Diagnosis not present

## 2020-01-25 DIAGNOSIS — Z955 Presence of coronary angioplasty implant and graft: Secondary | ICD-10-CM

## 2020-01-25 NOTE — Progress Notes (Signed)
Daily Session Note  Patient Details  Name: Lance [name] MRN: [id number] Date of Birth: [birth year] Referring Provider:     Cardiac Rehab from 12/10/2019 in [facility] Cardiac and Pulmonary Rehab  Referring Provider Delight Stare MD      Encounter Date: 01/25/2020  Check In:  Session Check In - 01/25/20 1034      Check-In   Supervising physician immediately available to respond to emergencies See telemetry face sheet for immediately available ER MD    Location [facility]-Cardiac & Pulmonary Rehab    Staff Present Renita Papa, RN BSN;Joseph [address] [place], [place], ACSM CEP, Exercise Physiologist    Virtual Visit No    Medication changes reported     No    Fall or balance concerns reported    No    Warm-up and Cool-down Performed on first and last piece of equipment    Resistance Training Performed Yes    VAD Patient? No    PAD/SET Patient? No      Pain Assessment   Currently in Pain? No/denies              Social History   Tobacco Use  Smoking Status Never Smoker  Smokeless Tobacco Never Used    Goals Met:  Independence with exercise equipment Exercise tolerated well No report of cardiac concerns or symptoms Strength training completed today  Goals Unmet:  Not Applicable  Comments: Pt able to follow exercise prescription today without complaint.  Will continue to monitor for progression.    Dr. Emily Filbert is Medical Director for [place] and [facility].

## 2020-01-27 ENCOUNTER — Other Ambulatory Visit: Payer: Self-pay

## 2020-01-27 ENCOUNTER — Encounter: Payer: Medicare Other | Admitting: *Deleted

## 2020-01-27 DIAGNOSIS — I214 Non-ST elevation (NSTEMI) myocardial infarction: Secondary | ICD-10-CM | POA: Diagnosis not present

## 2020-01-27 DIAGNOSIS — Z955 Presence of coronary angioplasty implant and graft: Secondary | ICD-10-CM

## 2020-01-27 NOTE — Progress Notes (Signed)
Daily Session Note  Patient Details  Name: Lance [name] MRN: [id number] Date of Birth: 1955/05/05 Referring Provider:     Cardiac Rehab from 12/10/2019 in [facility] Cardiac and Pulmonary Rehab  Referring Provider Delight Stare MD      Encounter Date: 01/27/2020  Check In:  Session Check In - 01/27/20 1135      Check-In   Supervising physician immediately available to respond to emergencies See telemetry face sheet for immediately available ER MD    Location [facility]-Cardiac & Pulmonary Rehab    Staff Present Heath Lark, RN, BSN, CCRP;Kelly [place], MPA, RN;Joseph [place] RCP,RRT,BSRT;Jessica [place], [place], [place], CCRP, [place], [place], ACSM CEP, Exercise Physiologist    Virtual Visit No    Medication changes reported     No    Fall or balance concerns reported    No    Warm-up and Cool-down Performed on first and last piece of equipment    Resistance Training Performed Yes    VAD Patient? No    PAD/SET Patient? No      Pain Assessment   Currently in Pain? No/denies              Social History   Tobacco Use  Smoking Status Never Smoker  Smokeless Tobacco Never Used    Goals Met:  Independence with exercise equipment Exercise tolerated well No report of cardiac concerns or symptoms  Goals Unmet:  Not Applicable  Comments: Pt able to follow exercise prescription today without complaint.  Will continue to monitor for progression.    Dr. Emily Filbert is Medical Director for [place] and [facility].

## 2020-01-29 ENCOUNTER — Other Ambulatory Visit: Payer: Self-pay

## 2020-01-29 ENCOUNTER — Encounter: Payer: Medicare Other | Admitting: *Deleted

## 2020-01-29 DIAGNOSIS — Z955 Presence of coronary angioplasty implant and graft: Secondary | ICD-10-CM

## 2020-01-29 DIAGNOSIS — I214 Non-ST elevation (NSTEMI) myocardial infarction: Secondary | ICD-10-CM

## 2020-01-29 NOTE — Progress Notes (Signed)
Daily Session Note  Patient Details  Name: Lance [name] MRN: [id number] Date of Birth: [birth year] Referring Provider:     Cardiac Rehab from 12/10/2019 in [facility] Cardiac and Pulmonary Rehab  Referring Provider Delight Stare MD      Encounter Date: 01/29/2020  Check In:  Session Check In - 01/29/20 1002      Check-In   Supervising physician immediately available to respond to emergencies See telemetry face sheet for immediately available ER MD    Location [facility]-Cardiac & Pulmonary Rehab    Staff Present Renita Papa, RN BSN;Joseph [address] [place], [place], RCEP, CCRP, CCET    Virtual Visit No    Medication changes reported     No    Fall or balance concerns reported    No    Warm-up and Cool-down Performed on first and last piece of equipment    Resistance Training Performed Yes    VAD Patient? No    PAD/SET Patient? No      Pain Assessment   Currently in Pain? No/denies              Social History   Tobacco Use  Smoking Status Never Smoker  Smokeless Tobacco Never Used    Goals Met:  Independence with exercise equipment Exercise tolerated well No report of cardiac concerns or symptoms Strength training completed today  Goals Unmet:  Not Applicable  Comments: Pt able to follow exercise prescription today without complaint.  Will continue to monitor for progression.    Dr. Emily Filbert is Medical Director for [place] and [facility].

## 2020-02-01 ENCOUNTER — Other Ambulatory Visit: Payer: Self-pay

## 2020-02-01 ENCOUNTER — Encounter: Payer: Medicare Other | Admitting: *Deleted

## 2020-02-01 DIAGNOSIS — I214 Non-ST elevation (NSTEMI) myocardial infarction: Secondary | ICD-10-CM | POA: Diagnosis not present

## 2020-02-01 DIAGNOSIS — Z955 Presence of coronary angioplasty implant and graft: Secondary | ICD-10-CM

## 2020-02-01 NOTE — Progress Notes (Signed)
Daily Session Note  Patient Details  Name: Lance [name] MRN: [id number] Date of Birth: 1956-01-14 Referring Provider:     Cardiac Rehab from 12/10/2019 in [facility] Cardiac and Pulmonary Rehab  Referring Provider Delight Stare MD      Encounter Date: 02/01/2020  Check In:  Session Check In - 02/01/20 1037      Check-In   Supervising physician immediately available to respond to emergencies See telemetry face sheet for immediately available ER MD    Location [facility]-Cardiac & Pulmonary Rehab    Staff Present Renita Papa, RN BSN;Joseph [address] [place], [place], ACSM CEP, Exercise Physiologist    Virtual Visit No    Medication changes reported     No    Fall or balance concerns reported    No    Warm-up and Cool-down Performed on first and last piece of equipment    Resistance Training Performed Yes    VAD Patient? No    PAD/SET Patient? No      Pain Assessment   Currently in Pain? No/denies              Social History   Tobacco Use  Smoking Status Never Smoker  Smokeless Tobacco Never Used    Goals Met:  Independence with exercise equipment Exercise tolerated well No report of cardiac concerns or symptoms Strength training completed today  Goals Unmet:  Not Applicable  Comments: Pt able to follow exercise prescription today without complaint.  Will continue to monitor for progression.    Dr. Emily Filbert is Medical Director for [place] and [facility].

## 2020-02-03 ENCOUNTER — Other Ambulatory Visit: Payer: Self-pay

## 2020-02-03 ENCOUNTER — Encounter: Payer: Medicare Other | Admitting: *Deleted

## 2020-02-03 DIAGNOSIS — I214 Non-ST elevation (NSTEMI) myocardial infarction: Secondary | ICD-10-CM

## 2020-02-03 DIAGNOSIS — Z955 Presence of coronary angioplasty implant and graft: Secondary | ICD-10-CM

## 2020-02-03 NOTE — Progress Notes (Signed)
Daily Session Note  Patient Details  Name: Lance [name] MRN: [id number] Date of Birth: [birth year] Referring Provider:     Cardiac Rehab from 12/10/2019 in [facility] Cardiac and Pulmonary Rehab  Referring Provider Delight Stare MD      Encounter Date: 02/03/2020  Check In:  Session Check In - 02/03/20 1150      Check-In   Staff Present Darel Hong, RN BSN;Joseph [place], [place], ACSM CEP, Exercise Physiologist    Virtual Visit No    Medication changes reported     No    Fall or balance concerns reported    No    Warm-up and Cool-down Performed on first and last piece of equipment    Resistance Training Performed Yes    VAD Patient? No    PAD/SET Patient? No      Pain Assessment   Currently in Pain? No/denies              Social History   Tobacco Use  Smoking Status Never Smoker  Smokeless Tobacco Never Used    Goals Met:  Independence with exercise equipment Exercise tolerated well No report of cardiac concerns or symptoms Strength training completed today  Goals Unmet:  Not Applicable  Comments: Pt able to follow exercise prescription today without complaint.  Will continue to monitor for progression.    Dr. Emily Filbert is Medical Director for [place] and [facility].

## 2020-02-05 ENCOUNTER — Other Ambulatory Visit: Payer: Self-pay

## 2020-02-05 DIAGNOSIS — I214 Non-ST elevation (NSTEMI) myocardial infarction: Secondary | ICD-10-CM | POA: Diagnosis not present

## 2020-02-05 DIAGNOSIS — Z955 Presence of coronary angioplasty implant and graft: Secondary | ICD-10-CM

## 2020-02-05 NOTE — Progress Notes (Signed)
Daily Session Note  Patient Details  Name: Lance [name] MRN: [id number] Date of Birth: 08/27/1955 Referring Provider:     Cardiac Rehab from 12/10/2019 in [facility] Cardiac and Pulmonary Rehab  Referring Provider Delight Stare MD      Encounter Date: 02/05/2020  Check In:      Social History   Tobacco Use  Smoking Status Never Smoker  Smokeless Tobacco Never Used    Goals Met:  Proper associated with RPD/PD & O2 Sat Independence with exercise equipment Exercise tolerated well No report of cardiac concerns or symptoms Strength training completed today  Goals Unmet:  Not Applicable  Comments: Pt able to follow exercise prescription today without complaint.  Will continue to monitor for progression.   Dr. Emily Filbert is Medical Director for [place] and [facility].

## 2020-02-08 ENCOUNTER — Encounter: Payer: Medicare Other | Attending: Family Medicine | Admitting: *Deleted

## 2020-02-08 ENCOUNTER — Other Ambulatory Visit: Payer: Self-pay

## 2020-02-08 DIAGNOSIS — Z955 Presence of coronary angioplasty implant and graft: Secondary | ICD-10-CM

## 2020-02-08 DIAGNOSIS — I214 Non-ST elevation (NSTEMI) myocardial infarction: Secondary | ICD-10-CM

## 2020-02-08 NOTE — Progress Notes (Signed)
Daily Session Note  Patient Details  Name: Lance [name] MRN: [id number] Date of Birth: [birth year] Referring Provider:     Cardiac Rehab from 12/10/2019 in [facility] Cardiac and Pulmonary Rehab  Referring Provider Delight Stare MD      Encounter Date: 02/08/2020  Check In:  Session Check In - 02/08/20 1108      Check-In   Supervising physician immediately available to respond to emergencies See telemetry face sheet for immediately available ER MD    Location [facility]-Cardiac & Pulmonary Rehab    Staff Present Renita Papa, RN BSN;Joseph Tedd Sias, [place], ACSM CEP, Exercise Physiologist;Susanne Bice, RN, BSN, CCRP    Virtual Visit No    Medication changes reported     No    Fall or balance concerns reported    No    Warm-up and Cool-down Performed on first and last piece of equipment    Resistance Training Performed Yes    VAD Patient? No    PAD/SET Patient? No      Pain Assessment   Currently in Pain? No/denies              Social History   Tobacco Use  Smoking Status Never Smoker  Smokeless Tobacco Never Used    Goals Met:  Independence with exercise equipment Exercise tolerated well No report of cardiac concerns or symptoms Strength training completed today  Goals Unmet:  Not Applicable  Comments: Pt able to follow exercise prescription today without complaint.  Will continue to monitor for progression.    Dr. Emily Filbert is Medical Director for [place] and [facility].

## 2020-02-10 ENCOUNTER — Other Ambulatory Visit: Payer: Self-pay

## 2020-02-10 ENCOUNTER — Encounter: Payer: Self-pay | Admitting: *Deleted

## 2020-02-10 DIAGNOSIS — I214 Non-ST elevation (NSTEMI) myocardial infarction: Secondary | ICD-10-CM | POA: Diagnosis not present

## 2020-02-10 DIAGNOSIS — Z955 Presence of coronary angioplasty implant and graft: Secondary | ICD-10-CM

## 2020-02-10 NOTE — Progress Notes (Signed)
Daily Session Note  Patient Details  Name: HIKEEM ANDERSSON MRN: [id number] Date of Birth: [birth year] Referring Provider:     Cardiac Rehab from 12/10/2019 in [facility] Cardiac [facility] Rehab  Referring Provider Delight Stare MD      Encounter Date: 02/10/2020  Check In:  Session Check In - 02/10/20 1005      Check-In   Supervising physician immediately available to respond to emergencies See telemetry face sheet for immediately available ER MD    Location [facility]-Cardiac & Pulmonary Rehab    Staff Present Birdie Sons, MPA, RN;Joseph Darrin Nipper, [place], RCEP, CCRP, CCET    Virtual Visit No    Medication changes reported     No    Fall or balance concerns reported    No    Warm-up and Cool-down Performed on first and last piece of equipment    Resistance Training Performed Yes    VAD Patient? No    PAD/SET Patient? No      Pain Assessment   Currently in Pain? No/denies              Social History   Tobacco Use  Smoking Status Never Smoker  Smokeless Tobacco Never Used    Goals Met:  Independence with exercise equipment Exercise tolerated well No report of cardiac concerns or symptoms Strength training completed today  Goals Unmet:  Not Applicable  Comments: Pt able to follow exercise prescription today without complaint.  Will continue to monitor for progression.    Dr. Emily Filbert is Medical Director for [place] and [facility].

## 2020-02-10 NOTE — Progress Notes (Signed)
Cardiac Individual Treatment Plan  Patient Details  Name: Lance [name] MRN: [id number] Date of Birth: Jun 24, 1955 Referring Provider:     Cardiac Rehab from 12/10/2019 in [facility] Cardiac and Pulmonary Rehab  Referring Provider Delight Stare MD      Initial Encounter Date:    Cardiac Rehab from 12/10/2019 in [facility] Cardiac and Pulmonary Rehab  Date 12/10/19      Visit Diagnosis: NSTEMI (non-ST elevated myocardial infarction) [facility])  Status post coronary artery stent placement  Patient's Home Medications on Admission:  Current Outpatient Medications:  .  Accu-Chek FastClix Lancets MISC, Apply topically., Disp: , Rfl:  .  ACCU-CHEK GUIDE test strip, , Disp: , Rfl:  .  amLODipine (NORVASC) 5 MG tablet, Take 5 mg by mouth daily.   (Patient not taking: Reported on 12/04/2019), Disp: , Rfl:  .  Blood Glucose Monitoring Suppl (FIFTY50 GLUCOSE METER 2.0) w/Device KIT, Disp. blood glucose meter kit preferred by patient's insurance. Dx: Diabetes, E11.9, Disp: , Rfl:  .  LANTUS SOLOSTAR 100 UNIT/ML Solostar Pen, Inject into the skin., Disp: , Rfl:  .  linagliptin (TRADJENTA) 5 MG TABS tablet, Take 5 mg by mouth daily. (Patient not taking: Reported on 12/04/2019), Disp: , Rfl:  .  lisinopril (ZESTRIL) 40 MG tablet, Take 40 mg by mouth daily., Disp: , Rfl:  .  lisinopril-hydrochlorothiazide (PRINZIDE,ZESTORETIC) 20-12.5 MG per tablet, Take 1 tablet by mouth daily.   (Patient not taking: Reported on 12/04/2019), Disp: , Rfl:  .  metFORMIN (GLUCOPHAGE-XR) 500 MG 24 hr tablet, Take 1,000 mg by mouth 2 (two) times daily. , Disp: , Rfl:  .  metoprolol succinate (TOPROL-XL) 50 MG 24 hr tablet, Take 50 mg by mouth daily., Disp: , Rfl:  .  Multiple Minerals (CALCIUM/MAGNESIUM/ZINC PO), Take by mouth daily. (Patient not taking: Reported on 12/04/2019), Disp: , Rfl:  .  nitroGLYCERIN (NITROSTAT) 0.4 MG SL tablet, Place under the tongue., Disp: , Rfl:  .  pioglitazone (ACTOS) 30 MG tablet, Take 30 mg by mouth daily.  (Patient not taking: Reported on 12/04/2019), Disp: , Rfl:  .  prasugrel (EFFIENT) 10 MG TABS tablet, Take 10 mg by mouth daily., Disp: , Rfl:  .  pravastatin (PRAVACHOL) 40 MG tablet, Take 40 mg by mouth daily., Disp: , Rfl:  .  TRUEPLUS 5-BEVEL PEN NEEDLES 31G X 6 MM MISC, , Disp: , Rfl:   Past Medical History: Past Medical History:  Diagnosis Date  . Anxiety   . Arthritis    knees  . Complication of anesthesia 2009   during shoulder block-ht rate dropped-had injection-did do surgery-  . Depression   . Diabetes mellitus   . Hyperlipidemia   . Hypertension   . Trochanteric bursitis of right hip     Tobacco Use: Social History   Tobacco Use  Smoking Status Never Smoker  Smokeless Tobacco Never Used    Labs: Recent Review Flowsheet Data    Labs for ITP Cardiac and Pulmonary Rehab Latest Ref Rng & Units 09/22/2007 06/09/2012   Cholestrol 0 - 200 mg/dL - 155   LDLCALC 0 - 100 mg/dL - 95   HDL 40 - 60 mg/dL - 34(L)   Trlycerides 0 - 200 mg/dL - 128   Hemoglobin A1c 4.2 - 6.3 % - 9.1(H)   TCO2 - 21 -       Exercise Target Goals: Exercise Program Goal: Individual exercise prescription set using results from initial 6 min walk test and THRR while considering  patient's activity barriers  and safety.   Exercise Prescription Goal: Initial exercise prescription builds to 30-45 minutes a day of aerobic activity, 2-3 days per week.  Home exercise guidelines will be given to patient during program as part of exercise prescription that the participant will acknowledge.   Education: Aerobic Exercise & Resistance Training: - Gives group verbal and written instruction on the various components of exercise. Focuses on aerobic and resistive training programs and the benefits of this training and how to safely progress through these programs..   Cardiac Rehab from 02/03/2020 in [facility] Cardiac and Pulmonary Rehab  Date 02/03/20  Educator [facility]  Instruction Review Code 1- Verbalizes  Understanding      Education: Exercise & Equipment Safety: - Individual verbal instruction and demonstration of equipment use and safety with use of the equipment.   Cardiac Rehab from 02/03/2020 in [facility] Cardiac and Pulmonary Rehab  Date 12/10/19  Educator [facility]  Instruction Review Code 1- Verbalizes Understanding      Education: Exercise Physiology & General Exercise Guidelines: - Group verbal and written instruction with models to review the exercise physiology of the cardiovascular system and associated critical values. Provides general exercise guidelines with specific guidelines to those with heart or lung disease.    Cardiac Rehab from 02/03/2020 in [facility] Cardiac and Pulmonary Rehab  Date 01/20/20  Educator [facility]  Instruction Review Code 1- Verbalizes Understanding      Education: Flexibility, Balance, Mind/Body Relaxation: Provides group verbal/written instruction on the benefits of flexibility and balance training, including mind/body exercise modes such as yoga, pilates and tai chi.  Demonstration and skill practice provided.   Activity Barriers & Risk Stratification:  Activity Barriers & Cardiac Risk Stratification - 12/10/19 1609      Activity Barriers & Cardiac Risk Stratification   Activity Barriers Joint Problems;Left Knee Replacement;Right Knee Replacement    Cardiac Risk Stratification Moderate           6 Minute Walk:  6 Minute Walk    Row Name 12/10/19 1614         6 Minute Walk   Phase Initial     Distance 1137 feet     Walk Time 6 minutes     # of Rest Breaks 0     MPH 2.15     METS 2.45     RPE 7     Perceived Dyspnea  0     VO2 Peak 8.6     Symptoms No     Resting HR 75 bpm     Resting BP 132/70     Resting Oxygen Saturation  96 %     Exercise Oxygen Saturation  during 6 min walk 98 %     Max Ex. HR 93 bpm     Max Ex. BP 146/74     2 Minute Post BP 116/72            Oxygen Initial Assessment:   Oxygen Re-Evaluation:   Oxygen  Discharge (Final Oxygen Re-Evaluation):   Initial Exercise Prescription:  Initial Exercise Prescription - 12/10/19 1600      Date of Initial Exercise RX and Referring Provider   Date 12/10/19    Referring Provider Delight Stare MD      Treadmill   MPH 1.9    Grade 0.5    Minutes 15    METs 2.59      NuStep   Level 2    SPM 80    Minutes 15    METs 2.4  REL-XR   Level 2    Speed 50    Minutes 15    METs 2.4      Biostep-RELP   Level 2    SPM 50    Minutes 15    METs 2.4      Prescription Details   Frequency (times per week) 3    Duration Progress to 30 minutes of continuous aerobic without signs/symptoms of physical distress      Intensity   THRR 40-80% of Max Heartrate 108-142    Ratings of Perceived Exertion 11-13    Perceived Dyspnea 0-4      Progression   Progression Continue to progress workloads to maintain intensity without signs/symptoms of physical distress.      Resistance Training   Training Prescription Yes    Weight 3    Reps 10-15           Perform Capillary Blood Glucose checks as needed.  Exercise Prescription Changes:  Exercise Prescription Changes    Row Name 12/10/19 1600 12/25/19 1000 12/30/19 1200 01/12/20 1400 01/25/20 0900     Response to Exercise   Blood Pressure (Admit) 132/70 -- 136/80 138/78 128/74   Blood Pressure (Exercise) 146/74 -- 148/70 144/74 136/64   Blood Pressure (Exit) 116/72 -- 128/76 124/72 126/70   Heart Rate (Admit) 75 bpm -- 92 bpm 75 bpm 66 bpm   Heart Rate (Exercise) 93 bpm -- 97 bpm 110 bpm 117 bpm   Heart Rate (Exit) 75 bpm -- 69 bpm 78 bpm 77 bpm   Oxygen Saturation (Admit) 96 % -- -- -- --   Oxygen Saturation (Exercise) 98 % -- -- -- --   Oxygen Saturation (Exit) 96 % -- -- -- --   Rating of Perceived Exertion (Exercise) 7 -- $Re'15 15 15   'heu$ Perceived Dyspnea (Exercise) 0 -- -- -- --   Symptoms none -- none none none   Comments walk test results -- -- -- --   Duration Progress to 30 minutes of   aerobic without signs/symptoms of physical distress -- Progress to 30 minutes of  aerobic without signs/symptoms of physical distress Continue with 30 min of aerobic exercise without signs/symptoms of physical distress. Continue with 30 min of aerobic exercise without signs/symptoms of physical distress.   Intensity -- -- THRR unchanged THRR unchanged THRR unchanged     Progression   Progression -- -- Continue to progress workloads to maintain intensity without signs/symptoms of physical distress. Continue to progress workloads to maintain intensity without signs/symptoms of physical distress. Continue to progress workloads to maintain intensity without signs/symptoms of physical distress.   Average METs -- -- -- 3.34 4     Resistance Training   Training Prescription -- -- Yes Yes Yes   Weight -- -- 8 lb 8 lb 8 lb   Reps -- -- 10-15 10-15 10-15     Interval Training   Interval Training -- -- -- No No     Treadmill   MPH -- -- 2.1 2.3 2.5   Grade -- -- 1.5 1.5 1.5   Minutes -- -- $Rem'15 15 15   'MfWQ$ METs -- -- 3.04 3.24 3.43     NuStep   Level -- -- 4 4 --   SPM -- -- 80 -- --   Minutes -- -- 15 15 --   METs -- -- 5 4.7 --     REL-XR   Level -- -- -- 2 4   Speed -- -- -- --  50   Minutes -- -- -- 15 15   METs -- -- -- 2.1 4.5     Home Exercise Plan   Plans to continue exercise at -- Home (comment)  home gym and equipment -- -- Home (comment)  home gym and equipment   Frequency -- Add 2 additional days to program exercise sessions. -- -- Add 2 additional days to program exercise sessions.   Initial Home Exercises Provided -- 12/25/19 -- -- 12/25/19   Row Name 02/09/20 0900             Response to Exercise   Blood Pressure (Admit) 136/64       Blood Pressure (Exercise) 148/68       Blood Pressure (Exit) 126/70       Heart Rate (Admit) 56 bpm       Heart Rate (Exercise) 115 bpm       Heart Rate (Exit) 86 bpm       Rating of Perceived Exertion (Exercise) 15       Symptoms none        Duration Continue with 30 min of aerobic exercise without signs/symptoms of physical distress.       Intensity THRR unchanged         Progression   Progression Continue to progress workloads to maintain intensity without signs/symptoms of physical distress.       Average METs 3.87         Resistance Training   Training Prescription Yes       Weight 8 lb       Reps 10-15         Interval Training   Interval Training No         Treadmill   MPH 3.1       Grade 1.5       Minutes 15       METs 3.92         Elliptical   Level 1         REL-XR   Level 5       Speed 50       Minutes 15       METs 6         Home Exercise Plan   Plans to continue exercise at Home (comment)  home gym and equipment       Frequency Add 2 additional days to program exercise sessions.       Initial Home Exercises Provided 12/25/19              Exercise Comments:   Exercise Goals and Review:  Exercise Goals    Row Name 12/10/19 1613             Exercise Goals   Increase Physical Activity Yes       Intervention Provide advice, education, support and counseling about physical activity/exercise needs.;Develop an individualized exercise prescription for aerobic and resistive training based on initial evaluation findings, risk stratification, comorbidities and participant's personal goals.       Expected Outcomes Short Term: Attend rehab on a regular basis to increase amount of physical activity.;Long Term: Add in home exercise to make exercise part of routine and to increase amount of physical activity.;Long Term: Exercising regularly at least 3-5 days a week.       Increase Strength and Stamina Yes       Intervention Provide advice, education, support and counseling about physical activity/exercise needs.;Develop an individualized exercise prescription for aerobic  and resistive training based on initial evaluation findings, risk stratification, comorbidities and participant's personal goals.        Expected Outcomes Short Term: Increase workloads from initial exercise prescription for resistance, speed, and METs.;Short Term: Perform resistance training exercises routinely during rehab and add in resistance training at home;Long Term: Improve cardiorespiratory fitness, muscular endurance and strength as measured by increased METs and functional capacity (6MWT)       Able to understand and use rate of perceived exertion (RPE) scale Yes       Intervention Provide education and explanation on how to use RPE scale       Expected Outcomes Short Term: Able to use RPE daily in rehab to express subjective intensity level;Long Term:  Able to use RPE to guide intensity level when exercising independently       Able to understand and use Dyspnea scale Yes       Intervention Provide education and explanation on how to use Dyspnea scale       Expected Outcomes Short Term: Able to use Dyspnea scale daily in rehab to express subjective sense of shortness of breath during exertion;Long Term: Able to use Dyspnea scale to guide intensity level when exercising independently       Knowledge and understanding of Target Heart Rate Range (THRR) Yes       Intervention Provide education and explanation of THRR including how the numbers were predicted and where they are located for reference       Expected Outcomes Short Term: Able to state/look up THRR;Short Term: Able to use daily as guideline for intensity in rehab;Long Term: Able to use THRR to govern intensity when exercising independently       Able to check pulse independently Yes       Intervention Provide education and demonstration on how to check pulse in carotid and radial arteries.;Review the importance of being able to check your own pulse for safety during independent exercise       Expected Outcomes Short Term: Able to explain why pulse checking is important during independent exercise;Long Term: Able to check pulse independently and accurately        Understanding of Exercise Prescription Yes       Intervention Provide education, explanation, and written materials on patient's individual exercise prescription       Expected Outcomes Short Term: Able to explain program exercise prescription;Long Term: Able to explain home exercise prescription to exercise independently              Exercise Goals Re-Evaluation :  Exercise Goals Re-Evaluation    Row Name 12/16/19 1015 12/25/19 0956 12/30/19 1241 12/30/19 1242 01/12/20 1427     Exercise Goal Re-Evaluation   Exercise Goals Review Increase Physical Activity;Able to understand and use rate of perceived exertion (RPE) scale;Understanding of Exercise Prescription;Knowledge and understanding of Target Heart Rate Range (THRR);Increase Strength and Stamina;Able to check pulse independently Increase Physical Activity;Able to understand and use rate of perceived exertion (RPE) scale;Understanding of Exercise Prescription;Knowledge and understanding of Target Heart Rate Range (THRR);Increase Strength and Stamina;Able to check pulse independently Increase Physical Activity;Able to understand and use rate of perceived exertion (RPE) scale;Understanding of Exercise Prescription;Knowledge and understanding of Target Heart Rate Range (THRR);Increase Strength and Stamina;Able to check pulse independently -- Increase Physical Activity;Increase Strength and Stamina;Improve claudication pain tolerance and improve walking ability   Comments Reviewed RPE and dyspnea scales, THR and program prescription with pt today.  Pt voiced understanding and was given  a copy of goals to take home. Eliyah is off to a good start in rehab.  He has already started to get back to his exercise routine. Reviewed home exercise with pt today.  Pt plans to use his home gym and equipment for exercise.  Reviewed THR, pulse, RPE, sign and symptoms, pulse oximetery and when to call 911 or MD.  Also discussed weather considerations and indoor options.   Pt voiced understanding. Nathanal is progressing well.  He has increased to 8 lb for strength work. He has also increased load on machines. Reyli has been doing well in rehab.  He is out this week with a COVID-19 exposure.  He is up to 2.3 mph on the treadmill.  We will continue to monitor his progress.   Expected Outcomes Short: Use RPE daily to regulate intensity. Long: Follow program prescription in THR. Short: Continue to add in exercise at home Long: Continue to follow program prescription. -- Short: exercise consistently in class and home Long:  increase stamina overall Short: Return to regular attendance once cleared.  Long; Continue to improve stamina   Row Name 01/25/20 0958 01/27/20 1001 02/09/20 0923         Exercise Goal Re-Evaluation   Exercise Goals Review Increase Physical Activity;Increase Strength and Stamina Increase Physical Activity;Increase Strength and Stamina Increase Physical Activity;Increase Strength and Stamina     Comments Caydn is progressing well with exercise. He does well on the elliptical.  Staff will monitor progess. Ifeanyichukwu works out on the treadmill and bike and [employer] - treadmill and bike (20 min each) - every day not at rehab (takes saturday and sunday off - grandchildren keep him active), weights every other day (15lbs at home) -10 minutes. Sante is doing well in rehab .He has increased his Treadmill speed to 3.1. He continues to exercise within appropriate range of his THR. He has recently tried the ellipitcal starting on level 1! Will continue to monitor.     Expected Outcomes Short: attend consistently Long:  improve overall MET level Short: attend consistently, continue exercising at home Long: continue to improve levels and weight time/amount. Short: Continue progression on elliptical Long: Increase overall stamina/ endurance            Discharge Exercise Prescription (Final Exercise Prescription Changes):  Exercise Prescription Changes - 02/09/20 0900       Response to Exercise   Blood Pressure (Admit) 136/64    Blood Pressure (Exercise) 148/68    Blood Pressure (Exit) 126/70    Heart Rate (Admit) 56 bpm    Heart Rate (Exercise) 115 bpm    Heart Rate (Exit) 86 bpm    Rating of Perceived Exertion (Exercise) 15    Symptoms none    Duration Continue with 30 min of aerobic exercise without signs/symptoms of physical distress.    Intensity THRR unchanged      Progression   Progression Continue to progress workloads to maintain intensity without signs/symptoms of physical distress.    Average METs 3.87      Resistance Training   Training Prescription Yes    Weight 8 lb    Reps 10-15      Interval Training   Interval Training No      Treadmill   MPH 3.1    Grade 1.5    Minutes 15    METs 3.92      Elliptical   Level 1      REL-XR   Level 5    Speed  50    Minutes 15    METs 6      Home Exercise Plan   Plans to continue exercise at Home (comment)   home gym and equipment   Frequency Add 2 additional days to program exercise sessions.    Initial Home Exercises Provided 12/25/19           Nutrition:  Target Goals: Understanding of nutrition guidelines, daily intake of sodium '1500mg'$ , cholesterol '200mg'$ , calories 30% from fat and 7% or less from saturated fats, daily to have 5 or more servings of fruits and vegetables.  Education: Controlling Sodium/Reading Food Labels -Group verbal and written material supporting the discussion of sodium use in heart healthy nutrition. Review and explanation with models, verbal and written materials for utilization of the food label.   Education: General Nutrition Guidelines/Fats and Fiber: -Group instruction provided by verbal, written material, models and posters to present the general guidelines for heart healthy nutrition. Gives an explanation and review of dietary fats and fiber.   Cardiac Rehab from 02/03/2020 in [facility] Cardiac and Pulmonary Rehab  Date 12/16/19  Educator [facility]   Instruction Review Code 1- Verbalizes Understanding      Biometrics:  Pre Biometrics - 12/10/19 1615      Pre Biometrics   Height 5' 8.8" (1.748 m)    Weight 263 lb 6.4 oz (119.5 kg)    BMI (Calculated) 39.1    Single Leg Stand 10 seconds            Nutrition Therapy Plan and Nutrition Goals:  Nutrition Therapy & Goals - 12/28/19 1057      Nutrition Therapy   Diet Heart healhty, Low Na, T2DM friendly    Protein (specify units) 95g    Fiber 30 grams    Whole Grain Foods 3 servings    Saturated Fats 12 max. grams    Fruits and Vegetables 5 servings/day    Sodium 1.5 grams      Personal Nutrition Goals   Nutrition Goal ST: Review paperwork, increase water, mindful eating (honor hunger) LT: Lose 30 pounds - has already lost 40 pounds    Comments B: bowl of cereal (cheerios and bran - 2% milk) and fruit or egg L: [place] or chicken sandwich (whole wheat) D: whatever his wife makes - vegetables, spaghetti, chicken, steak (1x.week), fish (fried - hasn't had recently). Discussed heart healthy and T2DM friendly eating as well as mindful eating as he reports being ravenous most of the time by the end of the day.      Intervention Plan   Intervention Prescribe, educate and counsel regarding individualized specific dietary modifications aiming towards targeted core components such as weight, hypertension, lipid management, diabetes, heart failure and other comorbidities.;Nutrition handout(s) given to patient.    Expected Outcomes Short Term Goal: Understand basic principles of dietary content, such as calories, fat, sodium, cholesterol and nutrients.;Short Term Goal: A plan has been developed with personal nutrition goals set during dietitian appointment.;Long Term Goal: Adherence to prescribed nutrition plan.           Nutrition Assessments:  Nutrition Assessments - 12/10/19 1559      MEDFICTS Scores   Pre Score 37           MEDIFICTS Score Key:          ?70 Need to make  dietary changes          40-70 Heart Healthy Diet         ? 40 Therapeutic  Level Cholesterol Diet  Nutrition Goals Re-Evaluation:  Nutrition Goals Re-Evaluation    [place] Name 12/25/19 0954 01/27/20 1008           Goals   Nutrition Goal -- ST: have trailmix - add some oats or more nuts and seeds to trailmix to "dilute" amount of dried fruit. LT: honor hunger with heart healthy snacks and habits      Comment Meets with Melissa on 12/28/19 He reports doing well with mindful eating - some days he is hungrier than others. He reports increasing his water intake - now drinking 4 16 oz bottles. he reports buying trailmix as a snack, but reports it raises his BG - discussed dried fruit sugar content. Continue with heart healthy eating and honoring hunger      Expected Outcome -- ST: have trailmix - add some oats or more nuts and seeds to trailmix to "dilute" amount of dried fruit. LT: honor hunger with heart healthy snacks and habits             Nutrition Goals Discharge (Final Nutrition Goals Re-Evaluation):  Nutrition Goals Re-Evaluation - 01/27/20 1008      Goals   Nutrition Goal ST: have trailmix - add some oats or more nuts and seeds to trailmix to "dilute" amount of dried fruit. LT: honor hunger with heart healthy snacks and habits    Comment He reports doing well with mindful eating - some days he is hungrier than others. He reports increasing his water intake - now drinking 4 16 oz bottles. he reports buying trailmix as a snack, but reports it raises his BG - discussed dried fruit sugar content. Continue with heart healthy eating and honoring hunger    Expected Outcome ST: have trailmix - add some oats or more nuts and seeds to trailmix to "dilute" amount of dried fruit. LT: honor hunger with heart healthy snacks and habits           Psychosocial: Target Goals: Acknowledge presence or absence of significant depression and/or stress, maximize coping skills, provide positive support  system. Participant is able to verbalize types and ability to use techniques and skills needed for reducing stress and depression.   Education: Depression - Provides group verbal and written instruction on the correlation between heart/lung disease and depressed mood, treatment options, and the stigmas associated with seeking treatment.   Education: Sleep Hygiene -Provides group verbal and written instruction about how sleep can affect your health.  Define sleep hygiene, discuss sleep cycles and impact of sleep habits. Review good sleep hygiene tips.     Education: Stress and Anxiety: - Provides group verbal and written instruction about the health risks of elevated stress and causes of high stress.  Discuss the correlation between heart/lung disease and anxiety and treatment options. Review healthy ways to manage with stress and anxiety.    Initial Review & Psychosocial Screening:  Initial Psych Review & Screening - 12/04/19 1310      Initial Review   Current issues with None Identified      Family Dynamics   Good Support System? Yes      Barriers   Psychosocial barriers to participate in program There are no identifiable barriers or psychosocial needs.;The patient should benefit from training in stress management and relaxation.      Screening Interventions   Interventions Encouraged to exercise;To provide support and resources with identified psychosocial needs;Provide feedback about the scores to participant    Expected Outcomes Short Term goal: Utilizing psychosocial  counselor, staff and physician to assist with identification of specific Stressors or current issues interfering with healing process. Setting desired goal for each stressor or current issue identified.;Long Term Goal: Stressors or current issues are controlled or eliminated.;Short Term goal: Identification and review with participant of any Quality of Life or Depression concerns found by scoring the  questionnaire.;Long Term goal: The participant improves quality of Life and PHQ9 Scores as seen by post scores and/or verbalization of changes           Quality of Life Scores:   Quality of Life - 12/10/19 1601      Quality of Life   Select Quality of Life      Quality of Life Scores   Health/Function Pre 28 %    Socioeconomic Pre 27 %    Psych/Spiritual Pre 28.29 %    Family Pre 30 %    GLOBAL Pre 28.18 %          Scores of 19 and below usually indicate a poorer quality of life in these areas.  A difference of  2-3 points is a clinically meaningful difference.  A difference of 2-3 points in the total score of the Quality of Life Index has been associated with significant improvement in overall quality of life, self-image, physical symptoms, and general health in studies assessing change in quality of life.  PHQ-9: Recent Review Flowsheet Data    Depression screen [facility] 2/9 12/10/2019   Decreased Interest 0   Down, Depressed, Hopeless 0   PHQ - 2 Score 0   Altered sleeping 0   Tired, decreased energy 0   Change in appetite 0   Trouble concentrating 0   Moving slowly or fidgety/restless 0   Suicidal thoughts 0   PHQ-9 Score 0   Difficult doing work/chores Not difficult at all     Interpretation of Total Score  Total Score Depression Severity:  1-4 = Minimal depression, 5-9 = Mild depression, 10-14 = Moderate depression, 15-19 = Moderately severe depression, 20-27 = Severe depression   Psychosocial Evaluation and Intervention:  Psychosocial Evaluation - 12/04/19 1331      Psychosocial Evaluation & Interventions   Comments 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 He is looking forward to learning what he can and can't do so he can get back to exercise. He has been on disability since 1996 due to both knees requiring multiple surgeries. He states things are going well for he and his wife, now that she can't fuss at him because he can't get all worked up after his MI.    Expected  Outcomes Short: attend cardiac rehab for educaion and exercise. Long: develop positive self care habits.    Continue Psychosocial Services  Follow up required by staff           Psychosocial Re-Evaluation:  Psychosocial Re-Evaluation    [place] Name 12/25/19 0952 01/27/20 1005           Psychosocial Re-Evaluation   Current issues with Current Stress Concerns;Current Sleep Concerns Current Sleep Concerns      Comments Natalio is off to a good start in rehab.  He is awaiting his hospital bill and a little worried about the charges.  Overall, he does not have any majors stressors.  He has a hard time getting to sleep but once out, he sleeps well. Vaden reports he was sleeping well, but recently has been tossing and turning - believes due to weather changes. He reports no stress concerns. To  reduce stress he will sometimes ride in the truck, exercise, play with grandchildren.      Expected Outcomes Short: Continue to work on sleep Long: Continue to stay positive. Short: Continue to use stress reducing techniques, monitor sleep patterns Long: Continue to stay positive.      Interventions Encouraged to attend Cardiac Rehabilitation for the exercise Encouraged to attend Cardiac Rehabilitation for the exercise      Continue Psychosocial Services  Follow up required by staff Follow up required by staff      Comments -- he reports doing well mentally and has no current stress concerns.        Initial Review   Source of Stress Concerns Financial --             Psychosocial Discharge (Final Psychosocial Re-Evaluation):  Psychosocial Re-Evaluation - 01/27/20 1005      Psychosocial Re-Evaluation   Current issues with Current Sleep Concerns    Comments Moris reports he was sleeping well, but recently has been tossing and turning - believes due to weather changes. He reports no stress concerns. To reduce stress he will sometimes ride in the truck, exercise, play with grandchildren.    Expected Outcomes  Short: Continue to use stress reducing techniques, monitor sleep patterns Long: Continue to stay positive.    Interventions Encouraged to attend Cardiac Rehabilitation for the exercise    Continue Psychosocial Services  Follow up required by staff    Comments he reports doing well mentally and has no current stress concerns.           Vocational Rehabilitation: Provide vocational rehab assistance to qualifying candidates.   Vocational Rehab Evaluation & Intervention:  Vocational Rehab - 12/04/19 1310      Initial Vocational Rehab Evaluation & Intervention   Assessment shows need for Vocational Rehabilitation No           Education: Education Goals: Education classes will be provided on a variety of topics geared toward better understanding of heart health and risk factor modification. Participant will state understanding/return demonstration of topics presented as noted by education test scores.  Learning Barriers/Preferences:  Learning Barriers/Preferences - 12/04/19 1310      Learning Barriers/Preferences   Learning Barriers None    Learning Preferences None           General Cardiac Education Topics:  AED/CPR: - Group verbal and written instruction with the use of models to demonstrate the basic use of the AED with the basic ABC's of resuscitation.   Anatomy & Physiology of the Heart: - Group verbal and written instruction and models provide basic cardiac anatomy and physiology, with the coronary electrical and arterial systems. Review of Valvular disease and Heart Failure   Cardiac Procedures: - Group verbal and written instruction to review commonly prescribed medications for heart disease. Reviews the medication, class of the drug, and side effects. Includes the steps to properly store meds and maintain the prescription regimen. (beta blockers and nitrates)   Cardiac Rehab from 02/03/2020 in [facility] Cardiac and Pulmonary Rehab  Date 02/03/20  Educator [facility]   Instruction Review Code 1- Verbalizes Understanding      Cardiac Medications I: - Group verbal and written instruction to review commonly prescribed medications for heart disease. Reviews the medication, class of the drug, and side effects. Includes the steps to properly store meds and maintain the prescription regimen.   Cardiac Rehab from 02/03/2020 in [facility] Cardiac and Pulmonary Rehab  Date 12/23/19  Educator [facility]  Instruction  Review Code 1- Verbalizes Understanding      Cardiac Medications II: -Group verbal and written instruction to review commonly prescribed medications for heart disease. Reviews the medication, class of the drug, and side effects. (all other drug classes)    Go Sex-Intimacy & Heart Disease, Get SMART - Goal Setting: - Group verbal and written instruction through game format to discuss heart disease and the return to sexual intimacy. Provides group verbal and written material to discuss and apply goal setting through the application of the S.M.A.R.T. Method.   Cardiac Rehab from 02/03/2020 in [facility] Cardiac and Pulmonary Rehab  Date 02/03/20  Educator [facility]  Instruction Review Code 1- Verbalizes Understanding      Other Matters of the Heart: - Provides group verbal, written materials and models to describe Stable Angina and Peripheral Artery. Includes description of the disease process and treatment options available to the cardiac patient.   Infection Prevention: - Provides verbal and written material to individual with discussion of infection control including proper hand washing and proper equipment cleaning during exercise session.   Cardiac Rehab from 02/03/2020 in [facility] Cardiac and Pulmonary Rehab  Date 12/10/19  Educator [place]  Instruction Review Code 1- Verbalizes Understanding      Falls Prevention: - Provides verbal and written material to individual with discussion of falls prevention and safety.   Cardiac Rehab from 02/03/2020 in [facility] Cardiac and  Pulmonary Rehab  Date 12/10/19  Educator [place]  Instruction Review Code 1- Verbalizes Understanding      Other: -Provides group and verbal instruction on various topics (see comments)   Knowledge Questionnaire Score:  Knowledge Questionnaire Score - 12/10/19 1557      Knowledge Questionnaire Score   Pre Score 19/26: Heart Failure, Heart Rate, Exercise, Nutrition           Core Components/Risk Factors/Patient Goals at Admission:  Personal Goals and Risk Factors at Admission - 12/10/19 1616      Core Components/Risk Factors/Patient Goals on Admission    Weight Management Yes;Weight Loss    Intervention Weight Management: Develop a combined nutrition and exercise program designed to reach desired caloric intake, while maintaining appropriate intake of nutrient and fiber, sodium and fats, and appropriate energy expenditure required for the weight goal.;Weight Management: Provide education and appropriate resources to help participant work on and attain dietary goals.;Weight Management/Obesity: Establish reasonable short term and long term weight goals.    Admit Weight 263 lb 6.4 oz (119.5 kg)    Goal Weight: Short Term 258 lb (117 kg)    Goal Weight: Long Term 253 lb (114.8 kg)    Expected Outcomes Short Term: Continue to assess and modify interventions until short term weight is achieved;Long Term: Adherence to nutrition and physical activity/exercise program aimed toward attainment of established weight goal;Weight Loss: Understanding of general recommendations for a balanced deficit meal plan, which promotes 1-2 lb weight loss per week and includes a negative energy balance of [phone] kcal/d;Understanding recommendations for meals to include 15-35% energy as protein, 25-35% energy from fat, 35-60% energy from carbohydrates, less than $RemoveB'200mg'wLffPdWM$  of dietary cholesterol, 20-35 gm of total fiber daily;Understanding of distribution of calorie intake throughout the day with the consumption of 4-5  meals/snacks    Diabetes Yes    Intervention Provide education about signs/symptoms and action to take for hypo/hyperglycemia.;Provide education about proper nutrition, including hydration, and aerobic/resistive exercise prescription along with prescribed medications to achieve blood glucose in normal ranges: Fasting glucose 65-99 mg/dL    Expected Outcomes  Short Term: Participant verbalizes understanding of the signs/symptoms and immediate care of hyper/hypoglycemia, proper foot care and importance of medication, aerobic/resistive exercise and nutrition plan for blood glucose control.;Long Term: Attainment of HbA1C < 7%.    Hypertension Yes    Intervention Provide education on lifestyle modifcations including regular physical activity/exercise, weight management, moderate sodium restriction and increased consumption of fresh fruit, vegetables, and low fat dairy, alcohol moderation, and smoking cessation.;Monitor prescription use compliance.    Expected Outcomes Short Term: Continued assessment and intervention until BP is < 140/58mm HG in hypertensive participants. < 130/52mm HG in hypertensive participants with diabetes, heart failure or chronic kidney disease.;Long Term: Maintenance of blood pressure at goal levels.    Lipids Yes    Intervention Provide education and support for participant on nutrition & aerobic/resistive exercise along with prescribed medications to achieve LDL '70mg'$ , HDL >$Remo'40mg'JSzuY$ .    Expected Outcomes Short Term: Participant states understanding of desired cholesterol values and is compliant with medications prescribed. Participant is following exercise prescription and nutrition guidelines.;Long Term: Cholesterol controlled with medications as prescribed, with individualized exercise RX and with personalized nutrition plan. Value goals: LDL < $Rem'70mg'PHas$ , HDL > 40 mg.           Education:Diabetes - Individual verbal and written instruction to review signs/symptoms of diabetes, desired  ranges of glucose level fasting, after meals and with exercise. Acknowledge that pre and post exercise glucose checks will be done for 3 sessions at entry of program.   Cardiac Rehab from 02/03/2020 in [facility] Cardiac and Pulmonary Rehab  Date 12/10/19  Educator [place]  Instruction Review Code 1- Verbalizes Understanding      Education: Know Your Numbers and Risk Factors: -Group verbal and written instruction about important numbers in your health.  Discussion of what are risk factors and how they play a role in the disease process.  Review of Cholesterol, Blood Pressure, Diabetes, and BMI and the role they play in your overall health.   Core Components/Risk Factors/Patient Goals Review:   Goals and Risk Factor Review    Row Name 12/25/19 0954 01/27/20 1014           Core Components/Risk Factors/Patient Goals Review   Personal Goals Review Weight Management/Obesity;Diabetes;Hypertension;Lipids Weight Management/Obesity;Diabetes;Hypertension      Review Ajit is doing well in rehab.  His weight is coming down.  His blood sugars are getting better.  He was 114 mg/dl today.  He usually checks 2-3x a day.  His pressures have been good in class but he does not have a good cuff at home. Weight has been fluctuating, his clothes are looser and his pants fall down sometimes. BG has been slightly elevated after trailmix, at night his blood sugar increase to ~200, morning 145 (2-3x/day). He does not have a BP cuff at home, his blood pressures in rehab have been doing well. No recent bloodwork - goes 02/22/20.      Expected Outcomes Short: Continue to work on weight loss Long: Continue to monitor risk factors. Short: Continue to attend rehab and take BG Long: Continue to monitor risk factors.             Core Components/Risk Factors/Patient Goals at Discharge (Final Review):   Goals and Risk Factor Review - 01/27/20 1014      Core Components/Risk Factors/Patient Goals Review   Personal Goals Review  Weight Management/Obesity;Diabetes;Hypertension    Review Weight has been fluctuating, his clothes are looser and his pants fall down sometimes. BG has been slightly  elevated after trailmix, at night his blood sugar increase to ~200, morning 145 (2-3x/day). He does not have a BP cuff at home, his blood pressures in rehab have been doing well. No recent bloodwork - goes 02/22/20.    Expected Outcomes Short: Continue to attend rehab and take BG Long: Continue to monitor risk factors.           ITP Comments:  ITP Comments    Row Name 12/04/19 1303 12/10/19 1553 12/16/19 1014 12/16/19 1148 01/12/20 1422   ITP Comments Initial orientation completed. Diagnosis can be found in [facility] 8/4. EP orientation scheduled for 9/2 at 2:30 Completed 6MWT and gym orientation. Initial ITP created and sent for review to Dr. Emily Filbert, Medical Director. First full day of exercise!  Patient was oriented to gym and equipment including functions, settings, policies, and procedures.  Patient's individual exercise prescription and treatment plan were reviewed.  All starting workloads were established based on the results of the 6 minute walk test done at initial orientation visit.  The plan for exercise progression was also introduced and progression will be customized based on patient's performance and goals. 30 day review completed. ITP sent to Dr. Emily Filbert, Medical Director of Cardiac and Pulmonary Rehab. Continue with ITP unless changes are made by physician. Domenique has been out due to a COVID-19 exposure.   [place] Name 01/13/20 0508 02/10/20 0600         ITP Comments 30 Day review completed. Medical Director ITP review done, changes made as directed, and signed approval by Medical Director. 30 Day review completed. Medical Director ITP review done, changes made as directed, and signed approval by Medical Director.             Comments:

## 2020-02-12 ENCOUNTER — Other Ambulatory Visit: Payer: Self-pay

## 2020-02-12 ENCOUNTER — Encounter: Payer: Medicare Other | Admitting: *Deleted

## 2020-02-12 DIAGNOSIS — Z955 Presence of coronary angioplasty implant and graft: Secondary | ICD-10-CM

## 2020-02-12 DIAGNOSIS — I214 Non-ST elevation (NSTEMI) myocardial infarction: Secondary | ICD-10-CM | POA: Diagnosis not present

## 2020-02-12 NOTE — Progress Notes (Signed)
Daily Session Note  Patient Details  Name: Lance [name] MRN: [id number] Date of Birth: [birth year] Referring Provider:     Cardiac Rehab from 12/10/2019 in [facility] Cardiac and Pulmonary Rehab  Referring Provider Delight Stare MD      Encounter Date: 02/12/2020  Check In:  Session Check In - 02/12/20 1014      Check-In   Supervising physician immediately available to respond to emergencies See telemetry face sheet for immediately available ER MD    Location [facility]-Cardiac & Pulmonary Rehab    Staff Present Heath Lark, RN, BSN, CCRP;Meredith Sherryll Burger, RN BSN;Jessica [place], MA, RCEP, CCRP, CCET;Joseph [place] RCP,RRT,BSRT    Virtual Visit Yes    Medication changes reported     No    Fall or balance concerns reported    No    Warm-up and Cool-down Performed on first and last piece of equipment    Resistance Training Performed Yes    VAD Patient? No    PAD/SET Patient? No      Pain Assessment   Currently in Pain? No/denies              Social History   Tobacco Use  Smoking Status Never Smoker  Smokeless Tobacco Never Used    Goals Met:  Independence with exercise equipment Exercise tolerated well No report of cardiac concerns or symptoms  Goals Unmet:  Not Applicable  Comments: Pt able to follow exercise prescription today without complaint.  Will continue to monitor for progression.    Dr. Emily Filbert is Medical Director for [place] and [facility].

## 2020-02-15 ENCOUNTER — Encounter: Payer: Medicare Other | Admitting: *Deleted

## 2020-02-15 ENCOUNTER — Other Ambulatory Visit: Payer: Self-pay

## 2020-02-15 VITALS — Ht 68.8 in | Wt 260.7 lb

## 2020-02-15 DIAGNOSIS — I214 Non-ST elevation (NSTEMI) myocardial infarction: Secondary | ICD-10-CM

## 2020-02-15 DIAGNOSIS — Z955 Presence of coronary angioplasty implant and graft: Secondary | ICD-10-CM

## 2020-02-15 NOTE — Progress Notes (Signed)
Daily Session Note  Patient Details  Name: Lance [name] MRN: [id number] Date of Birth: 1955/08/24 Referring Provider:     Cardiac Rehab from 12/10/2019 in [facility] Cardiac and Pulmonary Rehab  Referring Provider Delight Stare MD      Encounter Date: 02/15/2020  Check In:  Session Check In - 02/15/20 0955      Check-In   Supervising physician immediately available to respond to emergencies See telemetry face sheet for immediately available ER MD    Location [facility]-Cardiac & Pulmonary Rehab    Staff Present Heath Lark, RN, BSN, CCRP;Joseph [place] RCP,RRT,BSRT;Kelly [place], [place], ACSM CEP, Exercise Physiologist    Virtual Visit No    Medication changes reported     No    Fall or balance concerns reported    No    Warm-up and Cool-down Performed on first and last piece of equipment    Resistance Training Performed Yes    VAD Patient? No    PAD/SET Patient? No      Pain Assessment   Currently in Pain? No/denies              Social History   Tobacco Use  Smoking Status Never Smoker  Smokeless Tobacco Never Used    Goals Met:  Independence with exercise equipment Exercise tolerated well No report of cardiac concerns or symptoms  Goals Unmet:  Not Applicable  Comments: Pt able to follow exercise prescription today without complaint.  Will continue to monitor for progression.    Dr. Emily Filbert is Medical Director for [place] and [facility].

## 2020-02-17 ENCOUNTER — Other Ambulatory Visit: Payer: Self-pay

## 2020-02-17 DIAGNOSIS — Z955 Presence of coronary angioplasty implant and graft: Secondary | ICD-10-CM

## 2020-02-17 DIAGNOSIS — I214 Non-ST elevation (NSTEMI) myocardial infarction: Secondary | ICD-10-CM

## 2020-02-17 NOTE — Progress Notes (Signed)
Daily Session Note  Patient Details  Name: Lance [name] MRN: [id number] Date of Birth: 02-19-1956 Referring Provider:     Cardiac Rehab from 12/10/2019 in [facility] Cardiac and Pulmonary Rehab  Referring Provider Delight Stare MD      Encounter Date: 02/17/2020  Check In:  Session Check In - 02/17/20 0958      Check-In   Supervising physician immediately available to respond to emergencies See telemetry face sheet for immediately available ER MD    Location [facility]-Cardiac & Pulmonary Rehab    Staff Present Birdie Sons, MPA, RN;Amanda Oletta Darter, BA, ACSM CEP, Exercise Physiologist;Jessica Luan [place], MA, RCEP, CCRP, CCET    Virtual Visit No    Medication changes reported     No    Fall or balance concerns reported    No    Warm-up and Cool-down Performed on first and last piece of equipment    Resistance Training Performed Yes    VAD Patient? No    PAD/SET Patient? No      Pain Assessment   Currently in Pain? No/denies              Social History   Tobacco Use  Smoking Status Never Smoker  Smokeless Tobacco Never Used    Goals Met:  Independence with exercise equipment Exercise tolerated well No report of cardiac concerns or symptoms Strength training completed today  Goals Unmet:  Not Applicable  Comments: Pt able to follow exercise prescription today without complaint.  Will continue to monitor for progression.    Dr. Emily Filbert is Medical Director for [place] and [facility].

## 2020-02-19 ENCOUNTER — Other Ambulatory Visit: Payer: Self-pay

## 2020-02-19 ENCOUNTER — Encounter: Payer: Medicare Other | Admitting: *Deleted

## 2020-02-19 DIAGNOSIS — I214 Non-ST elevation (NSTEMI) myocardial infarction: Secondary | ICD-10-CM

## 2020-02-19 DIAGNOSIS — Z955 Presence of coronary angioplasty implant and graft: Secondary | ICD-10-CM

## 2020-02-19 NOTE — Progress Notes (Signed)
Daily Session Note  Patient Details  Name: Lance [name] MRN: [id number] Date of Birth: September 17, 1955 Referring Provider:     Cardiac Rehab from 12/10/2019 in [facility] Cardiac and Pulmonary Rehab  Referring Provider Delight Stare MD      Encounter Date: 02/19/2020  Check In:  Session Check In - 02/19/20 1021      Check-In   Supervising physician immediately available to respond to emergencies See telemetry face sheet for immediately available ER MD    Location [facility]-Cardiac & Pulmonary Rehab    Staff Present Renita Papa, RN BSN;Joseph Flavia Shipper;Heath Lark, RN, BSN, CCRP;Jessica [place], MA, RCEP, CCRP, CCET    Virtual Visit No    Medication changes reported     No    Fall or balance concerns reported    No    Warm-up and Cool-down Performed on first and last piece of equipment    Resistance Training Performed Yes    VAD Patient? No    PAD/SET Patient? No      Pain Assessment   Currently in Pain? No/denies              Social History   Tobacco Use  Smoking Status Never Smoker  Smokeless Tobacco Never Used    Goals Met:  Independence with exercise equipment Exercise tolerated well No report of cardiac concerns or symptoms Strength training completed today  Goals Unmet:  Not Applicable  Comments: Pt able to follow exercise prescription today without complaint.  Will continue to monitor for progression.    Dr. Emily Filbert is Medical Director for [place] and [facility].

## 2020-02-22 ENCOUNTER — Encounter: Payer: Medicare Other | Admitting: *Deleted

## 2020-02-22 ENCOUNTER — Other Ambulatory Visit: Payer: Self-pay

## 2020-02-22 DIAGNOSIS — Z955 Presence of coronary angioplasty implant and graft: Secondary | ICD-10-CM

## 2020-02-22 DIAGNOSIS — I214 Non-ST elevation (NSTEMI) myocardial infarction: Secondary | ICD-10-CM

## 2020-02-22 NOTE — Progress Notes (Signed)
Daily Session Note  Patient Details  Name: Lance [name] MRN: [id number] Date of Birth: 1955-12-16 Referring Provider:     Cardiac Rehab from 12/10/2019 in [facility]  Referring Provider Delight Stare MD      Encounter Date: 02/22/2020  Check In:  Session Check In - 02/22/20 1035      Check-In   Supervising physician immediately available to respond to emergencies See telemetry face sheet for immediately available ER MD    Location [facility]    Staff Present Renita Papa, RN BSN;Joseph [address] [place], [place], ACSM CEP, Exercise Physiologist    Virtual Visit No    Medication changes reported     No    Fall or balance concerns reported    No    Warm-up and Cool-down Performed on first and last piece of equipment    Resistance Training Performed Yes    VAD Patient? No    PAD/SET Patient? No      Pain Assessment   Currently in Pain? No/denies              Social History   Tobacco Use  Smoking Status Never Smoker  Smokeless Tobacco Never Used    Goals Met:  Independence with exercise equipment Exercise tolerated well No report of cardiac concerns or symptoms Strength training completed today  Goals Unmet:  Not Applicable  Comments: Pt able to follow exercise prescription today without complaint.  Will continue to monitor for progression.    Dr. Emily Filbert is Medical Director for [place] and [facility].

## 2020-02-24 ENCOUNTER — Other Ambulatory Visit: Payer: Self-pay

## 2020-02-24 DIAGNOSIS — I214 Non-ST elevation (NSTEMI) myocardial infarction: Secondary | ICD-10-CM | POA: Diagnosis not present

## 2020-02-24 DIAGNOSIS — Z955 Presence of coronary angioplasty implant and graft: Secondary | ICD-10-CM

## 2020-02-24 NOTE — Progress Notes (Signed)
Daily Session Note  Patient Details  Name: Lance [name] MRN: [id number] Date of Birth: [birth year] Referring Provider:     Cardiac Rehab from 12/10/2019 in [facility]  Referring Provider Lance Stare MD      Encounter Date: 02/24/2020  Check In:  Session Check In - 02/24/20 0951      Check-In   Supervising physician immediately available to respond to emergencies See telemetry face sheet for immediately available ER MD    Location [facility]    Staff Present Lance [name], MPA, Lance [name], BA, ACSM CEP, Exercise Physiologist;Lance [name] RCP,RRT,BSRT    Virtual Visit No    Medication changes reported     No    Fall or balance concerns reported    No    Warm-up and Cool-down Performed on first and last piece of equipment    Resistance Training Performed Yes    VAD Patient? No    PAD/SET Patient? No      Pain Assessment   Currently in Pain? No/denies              Social History   Tobacco Use  Smoking Status Never Smoker  Smokeless Tobacco Never Used    Goals Met:  Independence with exercise equipment Exercise tolerated well No report of cardiac concerns or symptoms Strength training completed today  Goals Unmet:  Not Applicable  Comments:  Lance [name] graduated today from  rehab with 36 sessions completed.  Details of the patient's exercise prescription and what He needs to do in order to continue the prescription and progress were discussed with patient.  Patient was given a copy of prescription and goals.  Patient verbalized understanding.  Lance [name] plans to continue to exercise by walking at home.    Dr. Emily [name] is Medical Director for [place] and [facility].

## 2020-02-24 NOTE — Progress Notes (Signed)
Cardiac Individual Treatment Plan  Patient Details  Name: Lance [name] MRN: [id number] Date of Birth: 15-Dec-1955 Referring Provider:     Cardiac Rehab from 12/10/2019 in [facility] Cardiac and Pulmonary Rehab  Referring Provider Delight Stare MD      Initial Encounter Date:    Cardiac Rehab from 12/10/2019 in [facility] Cardiac and Pulmonary Rehab  Date 12/10/19      Visit Diagnosis: NSTEMI (non-ST elevated myocardial infarction) [facility])  Status post coronary artery stent placement  Patient's Home Medications on Admission:  Current Outpatient Medications:  .  Accu-Chek FastClix Lancets MISC, Apply topically., Disp: , Rfl:  .  ACCU-CHEK GUIDE test strip, , Disp: , Rfl:  .  amLODipine (NORVASC) 5 MG tablet, Take 5 mg by mouth daily.   (Patient not taking: Reported on 12/04/2019), Disp: , Rfl:  .  Blood Glucose Monitoring Suppl (FIFTY50 GLUCOSE METER 2.0) w/Device KIT, Disp. blood glucose meter kit preferred by patient's insurance. Dx: Diabetes, E11.9, Disp: , Rfl:  .  LANTUS SOLOSTAR 100 UNIT/ML Solostar Pen, Inject into the skin., Disp: , Rfl:  .  linagliptin (TRADJENTA) 5 MG TABS tablet, Take 5 mg by mouth daily. (Patient not taking: Reported on 12/04/2019), Disp: , Rfl:  .  lisinopril (ZESTRIL) 40 MG tablet, Take 40 mg by mouth daily., Disp: , Rfl:  .  lisinopril-hydrochlorothiazide (PRINZIDE,ZESTORETIC) 20-12.5 MG per tablet, Take 1 tablet by mouth daily.   (Patient not taking: Reported on 12/04/2019), Disp: , Rfl:  .  metFORMIN (GLUCOPHAGE-XR) 500 MG 24 hr tablet, Take 1,000 mg by mouth 2 (two) times daily. , Disp: , Rfl:  .  metoprolol succinate (TOPROL-XL) 50 MG 24 hr tablet, Take 50 mg by mouth daily., Disp: , Rfl:  .  Multiple Minerals (CALCIUM/MAGNESIUM/ZINC PO), Take by mouth daily. (Patient not taking: Reported on 12/04/2019), Disp: , Rfl:  .  nitroGLYCERIN (NITROSTAT) 0.4 MG SL tablet, Place under the tongue., Disp: , Rfl:  .  pioglitazone (ACTOS) 30 MG tablet, Take 30 mg by mouth daily.  (Patient not taking: Reported on 12/04/2019), Disp: , Rfl:  .  prasugrel (EFFIENT) 10 MG TABS tablet, Take 10 mg by mouth daily., Disp: , Rfl:  .  pravastatin (PRAVACHOL) 40 MG tablet, Take 40 mg by mouth daily., Disp: , Rfl:  .  TRUEPLUS 5-BEVEL PEN NEEDLES 31G X 6 MM MISC, , Disp: , Rfl:   Past Medical History: Past Medical History:  Diagnosis Date  . Anxiety   . Arthritis    knees  . Complication of anesthesia 2009   during shoulder block-ht rate dropped-had injection-did do surgery-  . Depression   . Diabetes mellitus   . Hyperlipidemia   . Hypertension   . Trochanteric bursitis of right hip     Tobacco Use: Social History   Tobacco Use  Smoking Status Never Smoker  Smokeless Tobacco Never Used    Labs: Recent Review Flowsheet Data    Labs for ITP Cardiac and Pulmonary Rehab Latest Ref Rng & Units 09/22/2007 06/09/2012   Cholestrol 0 - 200 mg/dL - 155   LDLCALC 0 - 100 mg/dL - 95   HDL 40 - 60 mg/dL - 34(L)   Trlycerides 0 - 200 mg/dL - 128   Hemoglobin A1c 4.2 - 6.3 % - 9.1(H)   TCO2 - 21 -       Exercise Target Goals: Exercise Program Goal: Individual exercise prescription set using results from initial 6 min walk test and THRR while considering  patient's activity barriers  and safety.   Exercise Prescription Goal: Initial exercise prescription builds to 30-45 minutes a day of aerobic activity, 2-3 days per week.  Home exercise guidelines will be given to patient during program as part of exercise prescription that the participant will acknowledge.   Education: Aerobic Exercise: - Group verbal and visual presentation on the components of exercise prescription. Introduces F.I.T.T principle from ACSM for exercise prescriptions.  Reviews F.I.T.T. principles of aerobic exercise including progression. Written material given at graduation.   Cardiac Rehab from 02/10/2020 in [facility] Cardiac and Pulmonary Rehab  Date 02/03/20  Educator [facility]  Instruction Review Code 1-  Verbalizes Understanding      Education: Resistance Exercise: - Group verbal and visual presentation on the components of exercise prescription. Introduces F.I.T.T principle from ACSM for exercise prescriptions  Reviews F.I.T.T. principles of resistance exercise including progression. Written material given at graduation.    Education: Exercise & Equipment Safety: - Individual verbal instruction and demonstration of equipment use and safety with use of the equipment.   Cardiac Rehab from 02/10/2020 in [facility] Cardiac and Pulmonary Rehab  Date 12/10/19  Educator [place]  Instruction Review Code 1- Verbalizes Understanding      Education: Exercise Physiology & General Exercise Guidelines: - Group verbal and written instruction with models to review the exercise physiology of the cardiovascular system and associated critical values. Provides general exercise guidelines with specific guidelines to those with heart or lung disease.    Cardiac Rehab from 02/10/2020 in [facility] Cardiac and Pulmonary Rehab  Date 01/20/20  Educator [facility]  Instruction Review Code 1- Verbalizes Understanding      Education: Flexibility, Balance, Mind/Body Relaxation: - Group verbal and visual presentation with interactive activity on the components of exercise prescription. Introduces F.I.T.T principle from ACSM for exercise prescriptions. Reviews F.I.T.T. principles of flexibility and balance exercise training including progression. Also discusses the mind body connection.  Reviews various relaxation techniques to help reduce and manage stress (i.e. Deep breathing, progressive muscle relaxation, and visualization). Balance handout provided to take home. Written material given at graduation.   Cardiac Rehab from 02/10/2020 in [facility] Cardiac and Pulmonary Rehab  Date 02/10/20  Educator [facility]  Instruction Review Code 1- Verbalizes Understanding      Activity Barriers & Risk Stratification:  Activity Barriers & Cardiac Risk  Stratification - 12/10/19 1609      Activity Barriers & Cardiac Risk Stratification   Activity Barriers Joint Problems;Left Knee Replacement;Right Knee Replacement    Cardiac Risk Stratification Moderate           6 Minute Walk:  6 Minute Walk    Row Name 12/10/19 1614 02/15/20 1059       6 Minute Walk   Phase Initial Discharge    Distance 1137 feet 1540 feet    Distance % Change -- 35.4 %    Distance Feet Change -- 403 ft    Walk Time 6 minutes 6 minutes    # of Rest Breaks 0 0    MPH 2.15 2.92    METS 2.45 3.59    RPE 7 13    Perceived Dyspnea  0 --    VO2 Peak 8.6 12.56    Symptoms No Yes (comment)    Comments -- hip pian 3/10    Resting HR 75 bpm 72 bpm    Resting BP 132/70 142/76    Resting Oxygen Saturation  96 % --    Exercise Oxygen Saturation  during 6 min walk 98 % --  Max Ex. HR 93 bpm 113 bpm    Max Ex. BP 146/74 168/76    2 Minute Post BP 116/72 --           Oxygen Initial Assessment:   Oxygen Re-Evaluation:   Oxygen Discharge (Final Oxygen Re-Evaluation):   Initial Exercise Prescription:  Initial Exercise Prescription - 12/10/19 1600      Date of Initial Exercise RX and Referring Provider   Date 12/10/19    Referring Provider Delight Stare MD      Treadmill   MPH 1.9    Grade 0.5    Minutes 15    METs 2.59      NuStep   Level 2    SPM 80    Minutes 15    METs 2.4      REL-XR   Level 2    Speed 50    Minutes 15    METs 2.4      Biostep-RELP   Level 2    SPM 50    Minutes 15    METs 2.4      Prescription Details   Frequency (times per week) 3    Duration Progress to 30 minutes of continuous aerobic without signs/symptoms of physical distress      Intensity   THRR 40-80% of Max Heartrate 108-142    Ratings of Perceived Exertion 11-13    Perceived Dyspnea 0-4      Progression   Progression Continue to progress workloads to maintain intensity without signs/symptoms of physical distress.      Resistance Training    Training Prescription Yes    Weight 3    Reps 10-15           Perform Capillary Blood Glucose checks as needed.  Exercise Prescription Changes:  Exercise Prescription Changes    Row Name 12/10/19 1600 12/25/19 1000 12/30/19 1200 01/12/20 1400 01/25/20 0900     Response to Exercise   Blood Pressure (Admit) 132/70 -- 136/80 138/78 128/74   Blood Pressure (Exercise) 146/74 -- 148/70 144/74 136/64   Blood Pressure (Exit) 116/72 -- 128/76 124/72 126/70   Heart Rate (Admit) 75 bpm -- 92 bpm 75 bpm 66 bpm   Heart Rate (Exercise) 93 bpm -- 97 bpm 110 bpm 117 bpm   Heart Rate (Exit) 75 bpm -- 69 bpm 78 bpm 77 bpm   Oxygen Saturation (Admit) 96 % -- -- -- --   Oxygen Saturation (Exercise) 98 % -- -- -- --   Oxygen Saturation (Exit) 96 % -- -- -- --   Rating of Perceived Exertion (Exercise) 7 -- _0 Perceived Dyspnea (Exercise) 0 -- -- -- --   Symptoms none -- none none none   Comments walk test results -- -- -- --   Duration Progress to 30 minutes of  aerobic without signs/symptoms of physical distress -- Progress to 30 minutes of  aerobic without signs/symptoms of physical distress Continue with 30 min of aerobic exercise without signs/symptoms of physical distress. Continue with 30 min of aerobic exercise without signs/symptoms of physical distress.   Intensity -- -- THRR unchanged THRR unchanged THRR unchanged     Progression   Progression -- -- Continue to progress workloads to maintain intensity without signs/symptoms of physical distress. Continue to progress workloads to maintain intensity without signs/symptoms of physical distress. Continue to progress workloads to maintain intensity without signs/symptoms of physical distress.   Average METs -- -- -- 3.34 4  Resistance Training   Training Prescription -- -- Yes Yes Yes   Weight -- -- 8 lb 8 lb 8 lb   Reps -- -- 10-15 10-15 10-15     Interval Training   Interval Training -- -- -- No No     Treadmill   MPH -- --  2.1 2.3 2.5   Grade -- -- 1.5 1.5 1.5   Minutes -- -- _0 METs -- -- 3.04 3.24 3.43     NuStep   Level -- -- 4 4 --   SPM -- -- 80 -- --   Minutes -- -- 15 15 --   METs -- -- 5 4.7 --     REL-XR   Level -- -- -- 2 4   Speed -- -- -- -- 50   Minutes -- -- -- 15 15   METs -- -- -- 2.1 4.5     Home Exercise Plan   Plans to continue exercise at -- Home (comment)  home gym and equipment -- -- Home (comment)  home gym and equipment   Frequency -- Add 2 additional days to program exercise sessions. -- -- Add 2 additional days to program exercise sessions.   Initial Home Exercises Provided -- 12/25/19 -- -- 12/25/19   Row Name 02/09/20 0900 02/22/20 1200           Response to Exercise   Blood Pressure (Admit) 136/64 128/64      Blood Pressure (Exercise) 148/68 158/80      Blood Pressure (Exit) 126/70 134/70      Heart Rate (Admit) 56 bpm 59 bpm      Heart Rate (Exercise) 115 bpm 111 bpm      Heart Rate (Exit) 86 bpm 80 bpm      Rating of Perceived Exertion (Exercise) 15 15      Symptoms none none      Duration Continue with 30 min of aerobic exercise without signs/symptoms of physical distress. Continue with 30 min of aerobic exercise without signs/symptoms of physical distress.      Intensity THRR unchanged THRR unchanged        Progression   Progression Continue to progress workloads to maintain intensity without signs/symptoms of physical distress. Continue to progress workloads to maintain intensity without signs/symptoms of physical distress.      Average METs 3.87 3.5        Resistance Training   Training Prescription Yes Yes      Weight 8 lb 15 lb      Reps 10-15 10-15        Interval Training   Interval Training No No        Treadmill   MPH 3.1 --      Grade 1.5 --      Minutes 15 --      METs 3.92 --        NuStep   Level -- 4      SPM -- 80      Minutes -- 15      METs -- 3        Elliptical   Level 1 --        REL-XR   Level 5 --      Speed  50 --      Minutes 15 --      METs 6 --        Biostep-RELP   Level -- 3  SPM -- 50      Minutes -- 15      METs -- 4        Home Exercise Plan   Plans to continue exercise at Home (comment)  home gym and equipment Home (comment)  home gym and equipment      Frequency Add 2 additional days to program exercise sessions. Add 2 additional days to program exercise sessions.      Initial Home Exercises Provided 12/25/19 12/25/19             Exercise Comments:   Exercise Goals and Review:  Exercise Goals    Row Name 12/10/19 1613             Exercise Goals   Increase Physical Activity Yes       Intervention Provide advice, education, support and counseling about physical activity/exercise needs.;Develop an individualized exercise prescription for aerobic and resistive training based on initial evaluation findings, risk stratification, comorbidities and participant's personal goals.       Expected Outcomes Short Term: Attend rehab on a regular basis to increase amount of physical activity.;Long Term: Add in home exercise to make exercise part of routine and to increase amount of physical activity.;Long Term: Exercising regularly at least 3-5 days a week.       Increase Strength and Stamina Yes       Intervention Provide advice, education, support and counseling about physical activity/exercise needs.;Develop an individualized exercise prescription for aerobic and resistive training based on initial evaluation findings, risk stratification, comorbidities and participant's personal goals.       Expected Outcomes Short Term: Increase workloads from initial exercise prescription for resistance, speed, and METs.;Short Term: Perform resistance training exercises routinely during rehab and add in resistance training at home;Long Term: Improve cardiorespiratory fitness, muscular endurance and strength as measured by increased METs and functional capacity (6MWT)       Able to understand and  use rate of perceived exertion (RPE) scale Yes       Intervention Provide education and explanation on how to use RPE scale       Expected Outcomes Short Term: Able to use RPE daily in rehab to express subjective intensity level;Long Term:  Able to use RPE to guide intensity level when exercising independently       Able to understand and use Dyspnea scale Yes       Intervention Provide education and explanation on how to use Dyspnea scale       Expected Outcomes Short Term: Able to use Dyspnea scale daily in rehab to express subjective sense of shortness of breath during exertion;Long Term: Able to use Dyspnea scale to guide intensity level when exercising independently       Knowledge and understanding of Target Heart Rate Range (THRR) Yes       Intervention Provide education and explanation of THRR including how the numbers were predicted and where they are located for reference       Expected Outcomes Short Term: Able to state/look up THRR;Short Term: Able to use daily as guideline for intensity in rehab;Long Term: Able to use THRR to govern intensity when exercising independently       Able to check pulse independently Yes       Intervention Provide education and demonstration on how to check pulse in carotid and radial arteries.;Review the importance of being able to check your own pulse for safety during independent exercise       Expected Outcomes  Short Term: Able to explain why pulse checking is important during independent exercise;Long Term: Able to check pulse independently and accurately       Understanding of Exercise Prescription Yes       Intervention Provide education, explanation, and written materials on patient's individual exercise prescription       Expected Outcomes Short Term: Able to explain program exercise prescription;Long Term: Able to explain home exercise prescription to exercise independently              Exercise Goals Re-Evaluation :  Exercise Goals  Re-Evaluation    Row Name 12/16/19 1015 12/25/19 0956 12/30/19 1241 12/30/19 1242 01/12/20 1427     Exercise Goal Re-Evaluation   Exercise Goals Review Increase Physical Activity;Able to understand and use rate of perceived exertion (RPE) scale;Understanding of Exercise Prescription;Knowledge and understanding of Target Heart Rate Range (THRR);Increase Strength and Stamina;Able to check pulse independently Increase Physical Activity;Able to understand and use rate of perceived exertion (RPE) scale;Understanding of Exercise Prescription;Knowledge and understanding of Target Heart Rate Range (THRR);Increase Strength and Stamina;Able to check pulse independently Increase Physical Activity;Able to understand and use rate of perceived exertion (RPE) scale;Understanding of Exercise Prescription;Knowledge and understanding of Target Heart Rate Range (THRR);Increase Strength and Stamina;Able to check pulse independently -- Increase Physical Activity;Increase Strength and Stamina;Improve claudication pain tolerance and improve walking ability   Comments Reviewed RPE and dyspnea scales, THR and program prescription with pt today.  Pt voiced understanding and was given a copy of goals to take home. Lance [name] is off to a good start in rehab.  He has already started to get back to [name] exercise routine. Reviewed home exercise with pt today.  Pt plans to use [name] home gym and equipment for exercise.  Reviewed THR, pulse, RPE, sign and symptoms, pulse oximetery and when to call 911 or MD.  Also discussed weather considerations and indoor options.  Pt voiced understanding. Lance [name] is progressing well.  He has increased to 8 lb for strength work. He has also increased load on machines. Lance [name] has been doing well in rehab.  He is out this week with a COVID-19 exposure.  He is up to 2.3 mph on the treadmill.  We will continue to monitor [name] progress.   Expected Outcomes Short: Use RPE daily to regulate intensity. Long: Follow program  prescription in THR. Short: Continue to add in exercise at home Long: Continue to follow program prescription. -- Short: exercise consistently in class and home Long:  increase stamina overall Short: Return to regular attendance once cleared.  Long; Continue to improve stamina   Row Name 01/25/20 0958 01/27/20 1001 02/09/20 0923 02/17/20 0959       Exercise Goal Re-Evaluation   Exercise Goals Review Increase Physical Activity;Increase Strength and Stamina Increase Physical Activity;Increase Strength and Stamina Increase Physical Activity;Increase Strength and Stamina Increase Physical Activity;Increase Strength and Stamina;Understanding of Exercise Prescription    Comments Lance [name] is progressing well with exercise. He does well on the elliptical.  Staff will monitor progess. Lance [name] works out on the treadmill and bike and Lance [name] - treadmill and bike (20 min each) - every day not at rehab (takes saturday and sunday off - grandchildren keep him active), weights every other day (15lbs at home) -10 minutes. Lance [name] is doing well in rehab .He has increased [name] Treadmill speed to 3.1. He continues to exercise within appropriate range of [name] THR. He has recently tried the ellipitcal starting on level 1! Will continue to monitor. Lance [name]  post 6MWT by 35%!!  He is planning to continue to walk at home after graduation.  He feels that [name] strength and stamina have improved since starting rehab.    Expected Outcomes Short: attend consistently Long:  improve overall MET level Short: attend consistently, continue exercising at home Long: continue to improve levels and weight time/amount. Short: Continue progression on elliptical Long: Increase overall stamina/ endurance Continue to exercise independently           Discharge Exercise Prescription (Final Exercise Prescription Changes):  Exercise Prescription Changes - 02/22/20 1200      Response to Exercise   Blood Pressure (Admit) 128/64    Blood Pressure  (Exercise) 158/80    Blood Pressure (Exit) 134/70    Heart Rate (Admit) 59 bpm    Heart Rate (Exercise) 111 bpm    Heart Rate (Exit) 80 bpm    Rating of Perceived Exertion (Exercise) 15    Symptoms none    Duration Continue with 30 min of aerobic exercise without signs/symptoms of physical distress.    Intensity THRR unchanged      Progression   Progression Continue to progress workloads to maintain intensity without signs/symptoms of physical distress.    Average METs 3.5      Resistance Training   Training Prescription Yes    Weight 15 lb    Reps 10-15      Interval Training   Interval Training No      NuStep   Level 4    SPM 80    Minutes 15    METs 3      Biostep-RELP   Level 3    SPM 50    Minutes 15    METs 4      Home Exercise Plan   Plans to continue exercise at Home (comment)   home gym and equipment   Frequency Add 2 additional days to program exercise sessions.    Initial Home Exercises Provided 12/25/19           Nutrition:  Target Goals: Understanding of nutrition guidelines, daily intake of sodium '1500mg'$ , cholesterol '200mg'$ , calories 30% from fat and 7% or less from saturated fats, daily to have 5 or more servings of fruits and vegetables.  Education: All About Nutrition: -Group instruction provided by verbal, written material, interactive activities, discussions, models, and posters to present general guidelines for heart healthy nutrition including fat, fiber, MyPlate, the role of sodium in heart healthy nutrition, utilization of the nutrition label, and utilization of this knowledge for meal planning. Follow up email sent as well. Written material given at graduation.   Cardiac Rehab from 02/10/2020 in [facility] Cardiac and Pulmonary Rehab  Date 12/16/19  Educator [facility]  Instruction Review Code 1- Verbalizes Understanding      Biometrics:  Pre Biometrics - 12/10/19 1615      Pre Biometrics   Height 5' 8.8" (1.748 m)    Weight 263 lb 6.4 oz (119.5  kg)    BMI (Calculated) 39.1    Single Leg Stand 10 seconds           Post Biometrics - 02/15/20 1100       Post  Biometrics   Height 5' 8.8" (1.748 m)    Weight 260 lb 11.2 oz (118.3 kg)    BMI (Calculated) 38.7    Single Leg Stand 22.6 seconds           Nutrition Therapy Plan and Nutrition Goals:  Nutrition Therapy & Goals -  12/28/19 1057      Nutrition Therapy   Diet Heart healhty, Low Na, T2DM friendly    Protein (specify units) 95g    Fiber 30 grams    Whole Grain Foods 3 servings    Saturated Fats 12 max. grams    Fruits and Vegetables 5 servings/day    Sodium 1.5 grams      Personal Nutrition Goals   Nutrition Goal ST: Review paperwork, increase water, mindful eating (honor hunger) LT: Lose 30 pounds - has already lost 40 pounds    Comments B: bowl of cereal (cheerios and bran - 2% milk) and fruit or egg L: [place] or chicken sandwich (whole wheat) D: whatever [name] wife makes - vegetables, spaghetti, chicken, steak (1x.week), fish (fried - hasn't had recently). Discussed heart healthy and T2DM friendly eating as well as mindful eating as he reports being ravenous most of the time by the end of the day.      Intervention Plan   Intervention Prescribe, educate and counsel regarding individualized specific dietary modifications aiming towards targeted core components such as weight, hypertension, lipid management, diabetes, heart failure and other comorbidities.;Nutrition handout(s) given to patient.    Expected Outcomes Short Term Goal: Understand basic principles of dietary content, such as calories, fat, sodium, cholesterol and nutrients.;Short Term Goal: A plan has been developed with personal nutrition goals set during dietitian appointment.;Long Term Goal: Adherence to prescribed nutrition plan.           Nutrition Assessments:  Nutrition Assessments - 02/15/20 1030      MEDFICTS Scores   Post Score 22          MEDIFICTS Score Key:  ?70 Need to make  dietary changes   40-70 Heart Healthy Diet  ? 40 Therapeutic Level Cholesterol Diet   Picture Your Plate Scores:  <41 Unhealthy dietary pattern with much room for improvement.  41-50 Dietary pattern unlikely to meet recommendations for good health and room for improvement.  51-60 More healthful dietary pattern, with some room for improvement.   >60 Healthy dietary pattern, although there may be some specific behaviors that could be improved.    Nutrition Goals Re-Evaluation:  Nutrition Goals Re-Evaluation    Lance [name] Name 12/25/19 0954 01/27/20 1008 02/17/20 1004         Goals   Nutrition Goal -- ST: have trailmix - add some oats or more nuts and seeds to trailmix to "dilute" amount of dried fruit. LT: honor hunger with heart healthy snacks and habits Trail mix, watch hunger cues     Comment Meets with Melissa on 12/28/19 He reports doing well with mindful eating - some days he is hungrier than others. He reports increasing [name] water intake - now drinking 4 16 oz bottles. he reports buying trailmix as a snack, but reports it raises [name] BG - discussed dried fruit sugar content. Continue with heart healthy eating and honoring hunger Jene has  been doing better with [name] diet.  He knows that he needs to drink more water and is trying to.  He is trying to follow [name] hunger cues.  He is trying to pay closer attention to [name] body.     Expected Outcome -- ST: have trailmix - add some oats or more nuts and seeds to trailmix to "dilute" amount of dried fruit. LT: honor hunger with heart healthy snacks and habits Continue to focus on healthy eating.            Nutrition Goals Discharge (  Final Nutrition Goals Re-Evaluation):  Nutrition Goals Re-Evaluation - 02/17/20 1004      Goals   Nutrition Goal Trail mix, watch hunger cues    Comment Lance [name] has  been doing better with [name] diet.  He knows that he needs to drink more water and is trying to.  He is trying to follow [name] hunger cues.  He is trying  to pay closer attention to [name] body.    Expected Outcome Continue to focus on healthy eating.           Psychosocial: Target Goals: Acknowledge presence or absence of significant depression and/or stress, maximize coping skills, provide positive support system. Participant is able to verbalize types and ability to use techniques and skills needed for reducing stress and depression.   Education: Stress, Anxiety, and Depression - Group verbal and visual presentation to define topics covered.  Reviews how body is impacted by stress, anxiety, and depression.  Also discusses healthy ways to reduce stress and to treat/manage anxiety and depression.  Written material given at graduation.   Education: Sleep Hygiene -Provides group verbal and written instruction about how sleep can affect your health.  Define sleep hygiene, discuss sleep cycles and impact of sleep habits. Review good sleep hygiene tips.    Initial Review & Psychosocial Screening:  Initial Psych Review & Screening - 12/04/19 1310      Initial Review   Current issues with None Identified      Family Dynamics   Good Support System? Yes      Barriers   Psychosocial barriers to participate in program There are no identifiable barriers or psychosocial needs.;The patient should benefit from training in stress management and relaxation.      Screening Interventions   Interventions Encouraged to exercise;To provide support and resources with identified psychosocial needs;Provide feedback about the scores to participant    Expected Outcomes Short Term goal: Utilizing psychosocial counselor, staff and physician to assist with identification of specific Stressors or current issues interfering with healing process. Setting desired goal for each stressor or current issue identified.;Long Term Goal: Stressors or current issues are controlled or eliminated.;Short Term goal: Identification and review with participant of any Quality of Life or  Depression concerns found by scoring the questionnaire.;Long Term goal: The participant improves quality of Life and PHQ9 Scores as seen by post scores and/or verbalization of changes           Quality of Life Scores:   Quality of Life - 02/15/20 1029      Quality of Life   Select Quality of Life      Quality of Life Scores   Health/Function Pre 28 %    Health/Function Post 23.29 %    Health/Function % Change -16.82 %    Socioeconomic Pre 27 %    Socioeconomic Post 21.07 %    Socioeconomic % Change  -21.96 %    Psych/Spiritual Pre 28.29 %    Psych/Spiritual Post 26.57 %    Psych/Spiritual % Change -6.08 %    Family Pre 30 %    Family Post 27.3 %    Family % Change -9 %    GLOBAL Pre 28.18 %    GLOBAL Post 24.12 %    GLOBAL % Change -14.41 %          Scores of 19 and below usually indicate a poorer quality of life in these areas.  A difference of  2-3 points is a clinically meaningful difference.  A difference of 2-3 points in the total score of the Quality of Life Index has been associated with significant improvement in overall quality of life, self-image, physical symptoms, and general health in studies assessing change in quality of life.  PHQ-9: Recent Review Flowsheet Data    Depression screen [facility] 2/9 02/15/2020 12/10/2019   Decreased Interest 2 0   Down, Depressed, Hopeless 0 0   PHQ - 2 Score 2 0   Altered sleeping 2 0   Tired, decreased energy 2 0   Change in appetite 0 0   Feeling bad or failure about yourself  0 -   Trouble concentrating 0 0   Moving slowly or fidgety/restless 0 0   Suicidal thoughts 0 0   PHQ-9 Score 6 0   Difficult doing work/chores Somewhat difficult Not difficult at all     Interpretation of Total Score  Total Score Depression Severity:  1-4 = Minimal depression, 5-9 = Mild depression, 10-14 = Moderate depression, 15-19 = Moderately severe depression, 20-27 = Severe depression   Psychosocial Evaluation and Intervention:  Psychosocial  Evaluation - 12/04/19 1331      Psychosocial Evaluation & Interventions   Comments 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 He is looking forward to learning what he can and can't do so he can get back to exercise. He has been on disability since 1996 due to both knees requiring multiple surgeries. He states things are going well for he and [name] wife, now that she can't fuss at him because he can't get all worked up after [name] MI.    Expected Outcomes Short: attend cardiac rehab for educaion and exercise. Long: develop positive self care habits.    Continue Psychosocial Services  Follow up required by staff           Psychosocial Re-Evaluation:  Psychosocial Re-Evaluation    [place] Name 12/25/19 0952 01/27/20 1005 02/17/20 1000         Psychosocial Re-Evaluation   Current issues with Current Stress Concerns;Current Sleep Concerns Current Sleep Concerns Current Sleep Concerns     Comments Lance [name] is off to a good start in rehab.  He is awaiting [name] hospital bill and a little worried about the charges.  Overall, he does not have any majors stressors.  He has a hard time getting to sleep but once out, he sleeps well. Lance [name] reports he was sleeping well, but recently has been tossing and turning - believes due to weather changes. He reports no stress concerns. To reduce stress he will sometimes ride in the truck, exercise, play with grandchildren. Lance [name] is getting ready to graduate. He has enjoyed getting out of the house to go exercise and get into routine.  He is still struggling with [name] sleep from tossing and turning.  He forgot to set the stove clock back this weekend and showed up an hour early today. He still has days of feeling lazy, but overall doing better.     Expected Outcomes Short: Continue to work on sleep Long: Continue to stay positive. Short: Continue to use stress reducing techniques, monitor sleep patterns Long: Continue to stay positive. Short: Continue to exercise after graduation  Long:  Continue to stay positive.     Interventions Encouraged to attend Cardiac Rehabilitation for the exercise Encouraged to attend Cardiac Rehabilitation for the exercise Encouraged to attend Cardiac Rehabilitation for the exercise     Continue Psychosocial Services  Follow up required by staff Follow up required by staff --  Comments -- he reports doing well mentally and has no current stress concerns. --       Initial Review   Source of Stress Concerns Financial -- --            Psychosocial Discharge (Final Psychosocial Re-Evaluation):  Psychosocial Re-Evaluation - 02/17/20 1000      Psychosocial Re-Evaluation   Current issues with Current Sleep Concerns    Comments Lance [name] is getting ready to graduate. He has enjoyed getting out of the house to go exercise and get into routine.  He is still struggling with [name] sleep from tossing and turning.  He forgot to set the stove clock back this weekend and showed up an hour early today. He still has days of feeling lazy, but overall doing better.    Expected Outcomes Short: Continue to exercise after graduation  Long: Continue to stay positive.    Interventions Encouraged to attend Cardiac Rehabilitation for the exercise           Vocational Rehabilitation: Provide vocational rehab assistance to qualifying candidates.   Vocational Rehab Evaluation & Intervention:  Vocational Rehab - 12/04/19 1310      Initial Vocational Rehab Evaluation & Intervention   Assessment shows need for Vocational Rehabilitation No           Education: Education Goals: Education classes will be provided on a variety of topics geared toward better understanding of heart health and risk factor modification. Participant will state understanding/return demonstration of topics presented as noted by education test scores.  Learning Barriers/Preferences:  Learning Barriers/Preferences - 12/04/19 1310      Learning Barriers/Preferences   Learning Barriers None     Learning Preferences None           General Cardiac Education Topics:  AED/CPR: - Group verbal and written instruction with the use of models to demonstrate the basic use of the AED with the basic ABC's of resuscitation.   Anatomy and Cardiac Procedures: - Group verbal and visual presentation and models provide information about basic cardiac anatomy and function. Reviews the testing methods done to diagnose heart disease and the outcomes of the test results. Describes the treatment choices: Medical Management, Angioplasty, or Coronary Bypass Surgery for treating various heart conditions including Myocardial Infarction, Angina, Valve Disease, and Cardiac Arrhythmias.  Written material given at graduation.   Medication Safety: - Group verbal and visual instruction to review commonly prescribed medications for heart and lung disease. Reviews the medication, class of the drug, and side effects. Includes the steps to properly store meds and maintain the prescription regimen.  Written material given at graduation.   Cardiac Rehab from 02/10/2020 in [facility] Cardiac and Pulmonary Rehab  Date 12/23/19  Educator [facility]  Instruction Review Code 1- Verbalizes Understanding      Intimacy: - Group verbal instruction through game format to discuss how heart and lung disease can affect sexual intimacy. Written material given at graduation..   Cardiac Rehab from 02/10/2020 in [facility] Cardiac and Pulmonary Rehab  Date 01/27/20  Educator [facility]  Instruction Review Code 1- Verbalizes Understanding      Know Your Numbers and Heart Failure: - Group verbal and visual instruction to discuss disease risk factors for cardiac and pulmonary disease and treatment options.  Reviews associated critical values for Overweight/Obesity, Hypertension, Cholesterol, and Diabetes.  Discusses basics of heart failure: signs/symptoms and treatments.  Introduces Heart Failure Zone chart for action plan for heart failure.  Written  material given at graduation.   Infection  Prevention: - Provides verbal and written material to individual with discussion of infection control including proper hand washing and proper equipment cleaning during exercise session.   Cardiac Rehab from 02/10/2020 in [facility] Cardiac and Pulmonary Rehab  Date 12/10/19  Educator Lance  Instruction Review Code 1- Verbalizes Understanding      Falls Prevention: - Provides verbal and written material to individual with discussion of falls prevention and safety.   Cardiac Rehab from 02/10/2020 in [facility] Cardiac and Pulmonary Rehab  Date 12/10/19  Educator [place]  Instruction Review Code 1- Verbalizes Understanding      Other: -Provides group and verbal instruction on various topics (see comments)   Knowledge Questionnaire Score:  Knowledge Questionnaire Score - 02/15/20 1028      Knowledge Questionnaire Score   Post Score 23/26           Core Components/Risk Factors/Patient Goals at Admission:  Personal Goals and Risk Factors at Admission - 12/10/19 1616      Core Components/Risk Factors/Patient Goals on Admission    Weight Management Yes;Weight Loss    Intervention Weight Management: Develop a combined nutrition and exercise program designed to reach desired caloric intake, while maintaining appropriate intake of nutrient and fiber, sodium and fats, and appropriate energy expenditure required for the weight goal.;Weight Management: Provide education and appropriate resources to help participant work on and attain dietary goals.;Weight Management/Obesity: Establish reasonable short term and long term weight goals.    Admit Weight 263 lb 6.4 oz (119.5 kg)    Goal Weight: Short Term 258 lb (117 kg)    Goal Weight: Long Term 253 lb (114.8 kg)    Expected Outcomes Short Term: Continue to assess and modify interventions until short term weight is achieved;Long Term: Adherence to nutrition and physical activity/exercise program aimed toward attainment  of established weight goal;Weight Loss: Understanding of general recommendations for a balanced deficit meal plan, which promotes 1-2 lb weight loss per week and includes a negative energy balance of [phone] kcal/d;Understanding recommendations for meals to include 15-35% energy as protein, 25-35% energy from fat, 35-60% energy from carbohydrates, less than $RemoveB'200mg'BNsrNJbe$  of dietary cholesterol, 20-35 gm of total fiber daily;Understanding of distribution of calorie intake throughout the day with the consumption of 4-5 meals/snacks    Diabetes Yes    Intervention Provide education about signs/symptoms and action to take for hypo/hyperglycemia.;Provide education about proper nutrition, including hydration, and aerobic/resistive exercise prescription along with prescribed medications to achieve blood glucose in normal ranges: Fasting glucose 65-99 mg/dL    Expected Outcomes Short Term: Participant verbalizes understanding of the signs/symptoms and immediate care of hyper/hypoglycemia, proper foot care and importance of medication, aerobic/resistive exercise and nutrition plan for blood glucose control.;Long Term: Attainment of HbA1C < 7%.    Hypertension Yes    Intervention Provide education on lifestyle modifcations including regular physical activity/exercise, weight management, moderate sodium restriction and increased consumption of fresh fruit, vegetables, and low fat dairy, alcohol moderation, and smoking cessation.;Monitor prescription use compliance.    Expected Outcomes Short Term: Continued assessment and intervention until BP is < 140/40mm HG in hypertensive participants. < 130/5mm HG in hypertensive participants with diabetes, heart failure or chronic kidney disease.;Long Term: Maintenance of blood pressure at goal levels.    Lipids Yes    Intervention Provide education and support for participant on nutrition & aerobic/resistive exercise along with prescribed medications to achieve LDL '70mg'$ , HDL >$Remo'40mg'LCxwP$ .     Expected Outcomes Short Term: Participant states understanding of desired cholesterol values and  is compliant with medications prescribed. Participant is following exercise prescription and nutrition guidelines.;Long Term: Cholesterol controlled with medications as prescribed, with individualized exercise RX and with personalized nutrition plan. Value goals: LDL < $Rem'70mg'wisv$ , HDL > 40 mg.           Education:Diabetes - Individual verbal and written instruction to review signs/symptoms of diabetes, desired ranges of glucose level fasting, after meals and with exercise. Acknowledge that pre and post exercise glucose checks will be done for 3 sessions at entry of program.   Cardiac Rehab from 02/10/2020 in [facility] Cardiac and Pulmonary Rehab  Date 12/10/19  Educator [place]  Instruction Review Code 1- Verbalizes Understanding      Core Components/Risk Factors/Patient Goals Review:   Goals and Risk Factor Review    Row Name 12/25/19 0954 01/27/20 1014 02/17/20 1002         Core Components/Risk Factors/Patient Goals Review   Personal Goals Review Weight Management/Obesity;Diabetes;Hypertension;Lipids Weight Management/Obesity;Diabetes;Hypertension Weight Management/Obesity;Diabetes;Hypertension     Review Lance [name] is doing well in rehab.  [name] weight is coming down.  [name] blood sugars are getting better.  He was 114 mg/dl today.  He usually checks 2-3x a day.  [name] pressures have been good in class but he does not have a good cuff at home. Weight has been fluctuating, [name] clothes are looser and [name] pants fall down sometimes. BG has been slightly elevated after trailmix, at night [name] blood sugar increase to ~200, morning 145 (2-3x/day). He does not have a BP cuff at home, [name] blood pressures in rehab have been doing well. No recent bloodwork - goes 02/22/20. Lance [name]'s weight has started to creep up, but [name] pants are getting looser and he is fitting back in older pants again.  [name] sugars are doing better recently and  plans to stay on top of it.  [name] pressures continue to do well     Expected Outcomes Short: Continue to work on weight loss Long: Continue to monitor risk factors. Short: Continue to attend rehab and take BG Long: Continue to monitor risk factors. Continue to stay on top of risk factors.            Core Components/Risk Factors/Patient Goals at Discharge (Final Review):   Goals and Risk Factor Review - 02/17/20 1002      Core Components/Risk Factors/Patient Goals Review   Personal Goals Review Weight Management/Obesity;Diabetes;Hypertension    Review Lance [name]'s weight has started to creep up, but [name] pants are getting looser and he is fitting back in older pants again.  [name] sugars are doing better recently and plans to stay on top of it.  [name] pressures continue to do well    Expected Outcomes Continue to stay on top of risk factors.           ITP Comments:  ITP Comments    Row Name 12/04/19 1303 12/10/19 1553 12/16/19 1014 12/16/19 1148 01/12/20 1422   ITP Comments Initial orientation completed. Diagnosis can be found in [facility] 8/4. EP orientation scheduled for 9/2 at 2:30 Completed 6MWT and gym orientation. Initial ITP created and sent for review to Dr. Emily Filbert, Medical Director. First full day of exercise!  Patient was oriented to gym and equipment including functions, settings, policies, and procedures.  Patient's individual exercise prescription and treatment plan were reviewed.  All starting workloads were established based on the results of the 6 minute walk test done at initial orientation visit.  The plan for exercise progression was also introduced and progression  will be customized based on patient's performance and goals. 30 day review completed. ITP sent to Dr. Emily Filbert, Medical Director of Cardiac and Pulmonary Rehab. Continue with ITP unless changes are made by physician. Vernel has been out due to a COVID-19 exposure.   Row Name 01/13/20 0508 02/10/20 0600 02/24/20 0959        ITP Comments 30 Day review completed. Medical Director ITP review done, changes made as directed, and signed approval by Medical Director. 30 Day review completed. Medical Director ITP review done, changes made as directed, and signed approval by Medical Director. Lance [name] graduated today from  rehab with 36 sessions completed.  Details of the patient's exercise prescription and what He needs to do in order to continue the prescription and progress were discussed with patient.  Patient was given a copy of prescription and goals.  Patient verbalized understanding.  Lance [name] plans to continue to exercise by walking at home.            Comments: discharge ITP

## 2020-02-24 NOTE — Patient Instructions (Signed)
Discharge Patient Instructions  Patient Details  Name: Lance [name] MRN: [id number] Date of Birth: [birth year] Referring Provider:  Marguerita Merles, MD   Number of Visits: 36  Reason for Discharge:  Patient reached a stable level of exercise. Patient independent in their exercise. Patient has met program and personal goals.  Smoking History:  Social History   Tobacco Use  Smoking Status Never Smoker  Smokeless Tobacco Never Used    Diagnosis:  NSTEMI (non-ST elevated myocardial infarction) ([place])  Status post coronary artery stent placement  Initial Exercise Prescription:  Initial Exercise Prescription - 12/10/19 1600      Date of Initial Exercise RX and Referring Provider   Date 12/10/19    Referring Provider Delight Stare MD      Treadmill   MPH 1.9    Grade 0.5    Minutes 15    METs 2.59      NuStep   Level 2    SPM 80    Minutes 15    METs 2.4      REL-XR   Level 2    Speed 50    Minutes 15    METs 2.4      Biostep-RELP   Level 2    SPM 50    Minutes 15    METs 2.4      Prescription Details   Frequency (times per week) 3    Duration Progress to 30 minutes of continuous aerobic without signs/symptoms of physical distress      Intensity   THRR 40-80% of Max Heartrate 108-142    Ratings of Perceived Exertion 11-13    Perceived Dyspnea 0-4      Progression   Progression Continue to progress workloads to maintain intensity without signs/symptoms of physical distress.      Resistance Training   Training Prescription Yes    Weight 3    Reps 10-15           Discharge Exercise Prescription (Final Exercise Prescription Changes):  Exercise Prescription Changes - 02/22/20 1200      Response to Exercise   Blood Pressure (Admit) 128/64    Blood Pressure (Exercise) 158/80    Blood Pressure (Exit) 134/70    Heart Rate (Admit) 59 bpm    Heart Rate (Exercise) 111 bpm    Heart Rate (Exit) 80 bpm    Rating of Perceived Exertion (Exercise) 15     Symptoms none    Duration Continue with 30 min of aerobic exercise without signs/symptoms of physical distress.    Intensity THRR unchanged      Progression   Progression Continue to progress workloads to maintain intensity without signs/symptoms of physical distress.    Average METs 3.5      Resistance Training   Training Prescription Yes    Weight 15 lb    Reps 10-15      Interval Training   Interval Training No      NuStep   Level 4    SPM 80    Minutes 15    METs 3      Biostep-RELP   Level 3    SPM 50    Minutes 15    METs 4      Home Exercise Plan   Plans to continue exercise at Home (comment)   home gym and equipment   Frequency Add 2 additional days to program exercise sessions.    Initial Home Exercises Provided 12/25/19  Functional Capacity:  6 Minute Walk    Row Name 12/10/19 1614 02/15/20 1059       6 Minute Walk   Phase Initial Discharge    Distance 1137 feet 1540 feet    Distance % Change -- 35.4 %    Distance Feet Change -- 403 ft    Walk Time 6 minutes 6 minutes    # of Rest Breaks 0 0    MPH 2.15 2.92    METS 2.45 3.59    RPE 7 13    Perceived Dyspnea  0 --    VO2 Peak 8.6 12.56    Symptoms No Yes (comment)    Comments -- hip pian 3/10    Resting HR 75 bpm 72 bpm    Resting BP 132/70 142/76    Resting Oxygen Saturation  96 % --    Exercise Oxygen Saturation  during 6 min walk 98 % --    Max Ex. HR 93 bpm 113 bpm    Max Ex. BP 146/74 168/76    2 Minute Post BP 116/72 --          Nutrition & Weight - Outcomes:  Pre Biometrics - 12/10/19 1615      Pre Biometrics   Height 5' 8.8" (1.748 m)    Weight 263 lb 6.4 oz (119.5 kg)    BMI (Calculated) 39.1    Single Leg Stand 10 seconds           Post Biometrics - 02/15/20 1100       Post  Biometrics   Height 5' 8.8" (1.748 m)    Weight 260 lb 11.2 oz (118.3 kg)    BMI (Calculated) 38.7    Single Leg Stand 22.6 seconds           Nutrition:  Nutrition Therapy &  Goals - 12/28/19 1057      Nutrition Therapy   Diet Heart healhty, Low Na, T2DM friendly    Protein (specify units) 95g    Fiber 30 grams    Whole Grain Foods 3 servings    Saturated Fats 12 max. grams    Fruits and Vegetables 5 servings/day    Sodium 1.5 grams      Personal Nutrition Goals   Nutrition Goal ST: Review paperwork, increase water, mindful eating (honor hunger) LT: Lose 30 pounds - has already lost 40 pounds    Comments B: bowl of cereal (cheerios and bran - 2% milk) and fruit or egg L: [place] or chicken sandwich (whole wheat) D: whatever his wife makes - vegetables, spaghetti, chicken, steak (1x.week), fish (fried - hasn't had recently). Discussed heart healthy and T2DM friendly eating as well as mindful eating as he reports being ravenous most of the time by the end of the day.      Intervention Plan   Intervention Prescribe, educate and counsel regarding individualized specific dietary modifications aiming towards targeted core components such as weight, hypertension, lipid management, diabetes, heart failure and other comorbidities.;Nutrition handout(s) given to patient.    Expected Outcomes Short Term Goal: Understand basic principles of dietary content, such as calories, fat, sodium, cholesterol and nutrients.;Short Term Goal: A plan has been developed with personal nutrition goals set during dietitian appointment.;Long Term Goal: Adherence to prescribed nutrition plan.          Goals reviewed with patient; copy given to patient.

## 2020-02-24 NOTE — Progress Notes (Signed)
Discharge Progress Report  Patient Details  Name: Lance [name] MRN: [id number] Date of Birth: 14-Oct-1955 Referring Provider:     Cardiac Rehab from 12/10/2019 in [facility]  Referring Provider Delight Stare MD       Number of Visits: 36  Reason for Discharge:  Patient reached a stable level of exercise. Patient independent in their exercise. Patient has met program and personal goals.  Smoking History:  Social History   Tobacco Use  Smoking Status Never Smoker  Smokeless Tobacco Never Used    Diagnosis:  NSTEMI (non-ST elevated myocardial infarction) ([place])  Status post coronary artery stent placement  ADL [facility]:   Initial Exercise Prescription:  Initial Exercise Prescription - 12/10/19 1600      Date of Initial Exercise RX and Referring Provider   Date 12/10/19    Referring Provider Delight Stare MD      Treadmill   MPH 1.9    Grade 0.5    Minutes 15    METs 2.59      NuStep   Level 2    SPM 80    Minutes 15    METs 2.4      REL-XR   Level 2    Speed 50    Minutes 15    METs 2.4      Biostep-RELP   Level 2    SPM 50    Minutes 15    METs 2.4      Prescription Details   Frequency (times per week) 3    Duration Progress to 30 minutes of continuous aerobic without signs/symptoms of physical distress      Intensity   THRR 40-80% of Max Heartrate 108-142    Ratings of Perceived Exertion 11-13    Perceived Dyspnea 0-4      Progression   Progression Continue to progress workloads to maintain intensity without signs/symptoms of physical distress.      Resistance Training   Training Prescription Yes    Weight 3    Reps 10-15           Discharge Exercise Prescription (Final Exercise Prescription Changes):  Exercise Prescription Changes - 02/22/20 1200      Response to Exercise   Blood Pressure (Admit) 128/64    Blood Pressure (Exercise) 158/80    Blood Pressure (Exit) 134/70    Heart Rate (Admit) 59 bpm    Heart  Rate (Exercise) 111 bpm    Heart Rate (Exit) 80 bpm    Rating of Perceived Exertion (Exercise) 15    Symptoms none    Duration Continue with 30 min of aerobic exercise without signs/symptoms of physical distress.    Intensity THRR unchanged      Progression   Progression Continue to progress workloads to maintain intensity without signs/symptoms of physical distress.    Average METs 3.5      Resistance Training   Training Prescription Yes    Weight 15 lb    Reps 10-15      Interval Training   Interval Training No      NuStep   Level 4    SPM 80    Minutes 15    METs 3      Biostep-RELP   Level 3    SPM 50    Minutes 15    METs 4      Home Exercise Plan   Plans to continue exercise at Home (comment)   home gym and equipment  Frequency Add 2 additional days to program exercise sessions.    Initial Home Exercises Provided 12/25/19           Functional Capacity:  6 Minute Walk    Row Name 12/10/19 1614 02/15/20 1059       6 Minute Walk   Phase Initial Discharge    Distance 1137 feet 1540 feet    Distance % Change -- 35.4 %    Distance Feet Change -- 403 ft    Walk Time 6 minutes 6 minutes    # of Rest Breaks 0 0    MPH 2.15 2.92    METS 2.45 3.59    RPE 7 13    Perceived Dyspnea  0 --    VO2 Peak 8.6 12.56    Symptoms No Yes (comment)    Comments -- hip pian 3/10    Resting HR 75 bpm 72 bpm    Resting BP 132/70 142/76    Resting Oxygen Saturation  96 % --    Exercise Oxygen Saturation  during 6 min walk 98 % --    Max Ex. HR 93 bpm 113 bpm    Max Ex. BP 146/74 168/76    2 Minute Post BP 116/72 --           Psychological, QOL, Others - Outcomes: PHQ 2/9: Depression screen [facility] 2/9 02/15/2020 12/10/2019  Decreased Interest 2 0  Down, Depressed, Hopeless 0 0  PHQ - 2 Score 2 0  Altered sleeping 2 0  Tired, decreased energy 2 0  Change in appetite 0 0  Feeling bad or failure about yourself  0 -  Trouble concentrating 0 0  Moving slowly or  fidgety/restless 0 0  Suicidal thoughts 0 0  PHQ-9 Score 6 0  Difficult doing work/chores Somewhat difficult Not difficult at all    Quality of Life:  Quality of Life - 02/15/20 1029      Quality of Life   Select Quality of Life      Quality of Life Scores   Health/Function Pre 28 %    Health/Function Post 23.29 %    Health/Function % Change -16.82 %    Socioeconomic Pre 27 %    Socioeconomic Post 21.07 %    Socioeconomic % Change  -21.96 %    Psych/Spiritual Pre 28.29 %    Psych/Spiritual Post 26.57 %    Psych/Spiritual % Change -6.08 %    Family Pre 30 %    Family Post 27.3 %    Family % Change -9 %    GLOBAL Pre 28.18 %    GLOBAL Post 24.12 %    GLOBAL % Change -14.41 %             Nutrition & Weight - Outcomes:  Pre Biometrics - 12/10/19 1615      Pre Biometrics   Height 5' 8.8" (1.748 m)    Weight 263 lb 6.4 oz (119.5 kg)    BMI (Calculated) 39.1    Single Leg Stand 10 seconds           Post Biometrics - 02/15/20 1100       Post  Biometrics   Height 5' 8.8" (1.748 m)    Weight 260 lb 11.2 oz (118.3 kg)    BMI (Calculated) 38.7    Single Leg Stand 22.6 seconds           Nutrition:  Nutrition Therapy & Goals - 12/28/19 1057  Nutrition Therapy   Diet Heart healhty, Low Na, T2DM friendly    Protein (specify units) 95g    Fiber 30 grams    Whole Grain Foods 3 servings    Saturated Fats 12 max. grams    Fruits and Vegetables 5 servings/day    Sodium 1.5 grams      Personal Nutrition Goals   Nutrition Goal ST: Review paperwork, increase water, mindful eating (honor hunger) LT: Lose 30 pounds - has already lost 40 pounds    Comments B: bowl of cereal (cheerios and bran - 2% milk) and fruit or egg L: [place] or chicken sandwich (whole wheat) D: whatever his wife makes - vegetables, spaghetti, chicken, steak (1x.week), fish (fried - hasn't had recently). Discussed heart healthy and T2DM friendly eating as well as mindful eating as he reports  being ravenous most of the time by the end of the day.      Intervention Plan   Intervention Prescribe, educate and counsel regarding individualized specific dietary modifications aiming towards targeted core components such as weight, hypertension, lipid management, diabetes, heart failure and other comorbidities.;Nutrition handout(s) given to patient.    Expected Outcomes Short Term Goal: Understand basic principles of dietary content, such as calories, fat, sodium, cholesterol and nutrients.;Short Term Goal: A plan has been developed with personal nutrition goals set during dietitian appointment.;Long Term Goal: Adherence to prescribed nutrition plan.           Nutrition Discharge:  Nutrition Assessments - 02/15/20 1030      MEDFICTS Scores   Post Score 22           Education Questionnaire Score:  Knowledge Questionnaire Score - 02/15/20 1028      Knowledge Questionnaire Score   Post Score 23/26           Goals reviewed with patient; copy given to patient.

## 2020-03-07 ENCOUNTER — Encounter: Admit: 2020-03-07 | Discharge: 2020-03-08 | Payer: MEDICARE

## 2020-03-07 DIAGNOSIS — I214 Non-ST elevation (NSTEMI) myocardial infarction: Principal | ICD-10-CM

## 2020-03-07 DIAGNOSIS — E785 Hyperlipidemia, unspecified: Principal | ICD-10-CM

## 2020-03-07 DIAGNOSIS — I5020 Unspecified systolic (congestive) heart failure: Principal | ICD-10-CM

## 2020-03-07 MED ORDER — EVOLOCUMAB 140 MG/ML SUBCUTANEOUS PEN INJECTOR
SUBCUTANEOUS | 3 refills | 0 days | Status: CP
Start: 2020-03-07 — End: ?

## 2020-03-08 DIAGNOSIS — E785 Hyperlipidemia, unspecified: Principal | ICD-10-CM

## 2020-03-08 DIAGNOSIS — I214 Non-ST elevation (NSTEMI) myocardial infarction: Principal | ICD-10-CM

## 2020-05-09 NOTE — Unmapped (Signed)
[facility] Specialty Medication Onboarding    Specialty Medication: Repatha Sureclick 140 mg/ml pen  Prior Authorization: Approved   Financial Assistance: No - MAPs has reached maximum number of attempts to obtain necessary information from the patient in regards to the financial assistance process  Final Copay/Day Supply: $141 / 84 day supply     Insurance Restrictions: None     Notes to Pharmacist:     The triage team has completed the benefits investigation and has determined that the patient is able to fill this medication at [facility]. Please contact the patient to complete the onboarding or follow up with the prescribing physician as needed.

## 2020-05-10 NOTE — Unmapped (Signed)
This patient has been disenrolled from the [facility] specialty pharmacy services. Per MAPs specialist, Mr Apple states he does not want to pursue assistance at this time because he does not want to have to worry about how he will afford it in the future MAPs expressed that there are other avenues of assistance IF the grant is no longer avaliable and that we can do everything possible however at this time he does not want to start medication. MAPs did leave him with contact information in case he changes his mind in the future.Camillo Flaming  [facility] Specialty Pharmacist

## 2020-05-11 NOTE — Unmapped (Signed)
Called patient at pharmacist's request.   Patient states that his arm has been hurting off and on since his stent placement in August.   Denies temperature difference in hands, no numbness.   Advised that he make an appointment with his PCP to evaluate his arm.   Verbalized understanding

## 2020-05-11 NOTE — Unmapped (Signed)
-----   Message from Camillo Flaming, PharmD sent at 05/10/2020  3:33 PM EST -----  Regarding: Arm pain after stent placement  Good afternoon,    I spoke to Jackson Torres to day regarding Repatha prescription. He needs to speak with MAPs to apply for financial assistance. Hopefully we can get that arranged for him.    While on the phone, he asked me about his arm hurting after stent placement. He said he had stent in August and his arm is still causing him a lot of pain. That is not really my area of expertise so I advised he call the office. I'm not sure if he will actually call though. Would you mind following up with him?    Thank you,  Camillo Flaming, PharmD - Clinical Pharmacist - [facility]   [address], [place], [place] [address]   Phone: [phone] - Fax. [phone]

## 2020-07-22 MED ORDER — METOPROLOL SUCCINATE ER 50 MG TABLET,EXTENDED RELEASE 24 HR
ORAL_TABLET | Freq: Every day | ORAL | 5 refills | 30.00000 days | Status: CP
Start: 2020-07-22 — End: 2021-01-18

## 2020-07-22 MED ORDER — PRAVASTATIN 40 MG TABLET
ORAL_TABLET | Freq: Every day | ORAL | 5 refills | 30 days | Status: CP
Start: 2020-07-22 — End: 2021-01-18

## 2020-08-29 ENCOUNTER — Encounter: Admit: 2020-08-29 | Discharge: 2020-08-30 | Payer: MEDICARE

## 2020-08-29 ENCOUNTER — Ambulatory Visit: Admit: 2020-08-29 | Discharge: 2020-08-30 | Payer: MEDICARE

## 2020-08-29 DIAGNOSIS — I214 Non-ST elevation (NSTEMI) myocardial infarction: Principal | ICD-10-CM

## 2020-08-29 DIAGNOSIS — I10 Essential (primary) hypertension: Principal | ICD-10-CM

## 2020-08-29 DIAGNOSIS — E119 Type 2 diabetes mellitus without complications: Principal | ICD-10-CM

## 2020-08-29 DIAGNOSIS — E785 Hyperlipidemia, unspecified: Principal | ICD-10-CM

## 2020-08-29 MED ORDER — LOSARTAN 25 MG TABLET
ORAL_TABLET | Freq: Every day | ORAL | 6 refills | 30 days | Status: CP
Start: 2020-08-29 — End: 2020-09-28

## 2020-08-29 MED ORDER — ATORVASTATIN 20 MG TABLET
ORAL_TABLET | Freq: Every day | ORAL | 6 refills | 30 days | Status: CP
Start: 2020-08-29 — End: 2021-03-27

## 2020-10-05 MED ORDER — DULOXETINE 30 MG CAPSULE,DELAYED RELEASE
0 days
Start: 2020-10-05 — End: ?

## 2020-10-31 MED ORDER — TRAMADOL 50 MG TABLET
0 days
Start: 2020-10-31 — End: ?

## 2020-11-08 MED ORDER — TIZANIDINE 4 MG TABLET
0 days
Start: 2020-11-08 — End: ?

## 2020-11-21 ENCOUNTER — Encounter
Admit: 2020-11-21 | Discharge: 2020-11-22 | Disposition: A | Discharge status: Home / Self Care | Payer: MEDICARE | Attending: Emergency Medicine

## 2020-11-21 ENCOUNTER — Emergency Department
Admit: 2020-11-21 | Discharge: 2020-11-22 | Disposition: A | Discharge status: Home / Self Care | Payer: MEDICARE | Attending: Emergency Medicine

## 2020-11-22 MED ORDER — CYCLOBENZAPRINE 5 MG TABLET
ORAL_TABLET | Freq: Two times a day (BID) | ORAL | 0 refills | 10 days | Status: CP | PRN
Start: 2020-11-22 — End: ?

## 2020-11-25 ENCOUNTER — Ambulatory Visit
Admit: 2020-11-25 | Discharge: 2020-11-26 | Payer: MEDICARE | Attending: Rehabilitative and Restorative Service Providers" | Primary: Rehabilitative and Restorative Service Providers"

## 2020-12-02 DIAGNOSIS — M11269 Other chondrocalcinosis, unspecified knee: Principal | ICD-10-CM

## 2020-12-02 DIAGNOSIS — M2550 Pain in unspecified joint: Principal | ICD-10-CM

## 2020-12-02 MED ORDER — PREDNISONE 10 MG TABLET
ORAL_TABLET | ORAL | 0 refills | 0.00000 days | Status: CP
Start: 2020-12-02 — End: 2020-12-02

## 2020-12-05 ENCOUNTER — Ambulatory Visit: Admit: 2020-12-05 | Discharge: 2020-12-06 | Payer: MEDICARE

## 2020-12-05 DIAGNOSIS — I214 Non-ST elevation (NSTEMI) myocardial infarction: Principal | ICD-10-CM

## 2020-12-05 DIAGNOSIS — E119 Type 2 diabetes mellitus without complications: Principal | ICD-10-CM

## 2020-12-05 DIAGNOSIS — E785 Hyperlipidemia, unspecified: Principal | ICD-10-CM

## 2020-12-05 DIAGNOSIS — I10 Essential (primary) hypertension: Principal | ICD-10-CM

## 2020-12-07 ENCOUNTER — Ambulatory Visit: Payer: Medicare Other | Attending: Family Medicine | Admitting: Physical Therapy

## 2020-12-07 ENCOUNTER — Other Ambulatory Visit: Payer: Self-pay

## 2020-12-07 VITALS — BP 140/79 | HR 77

## 2020-12-07 DIAGNOSIS — M25562 Pain in left knee: Secondary | ICD-10-CM | POA: Diagnosis present

## 2020-12-07 DIAGNOSIS — M25561 Pain in right knee: Secondary | ICD-10-CM | POA: Diagnosis present

## 2020-12-07 DIAGNOSIS — R262 Difficulty in walking, not elsewhere classified: Secondary | ICD-10-CM | POA: Diagnosis present

## 2020-12-07 DIAGNOSIS — G8929 Other chronic pain: Secondary | ICD-10-CM | POA: Diagnosis present

## 2020-12-07 NOTE — Therapy (Signed)
[facility] [address] [place], [address] Phone: [phone]   Fax:  [phone]  Physical Therapy Evaluation  Patient Details  Name: Lance [name] MRN: [id number] Date of Birth: Jul 09, 1955 Referring Provider (PT): Darreld Mclean MD  Patient will have improved function and activity level as evidenced by an increase in FOTO score by 10 points or more.   Encounter Date: 12/07/2020   PT End of Session - 12/07/20 1650     Visit Number 1    Number of Visits 11    Date for PT Re-Evaluation 01/11/21    PT Start Time 1630    PT Stop Time 1715    PT Time Calculation (min) 45 min             Past Medical History:  Diagnosis Date   Anxiety    Arthritis    knees   Complication of anesthesia 2009   during shoulder block-ht rate dropped-had injection-did do surgery-   Depression    Diabetes mellitus    Hyperlipidemia    Hypertension    Trochanteric bursitis of right hip     Past Surgical History:  Procedure Laterality Date   CARDIAC CATHETERIZATION  2004   normal   EXCISION/RELEASE BURSA HIP Right 12/23/2014   Procedure: RIGHT RESECTION GREATER TROCHANTERIC BURSA;  Surgeon: Loreta Ave, MD;  Location: [facility];  Service: Orthopedics;  Laterality: Right;   ILIOTIBIAL BAND RELEASE Right 12/23/2014   Procedure: RIGHT HIP ILIO-TIBIAL BAND RELEASE;  Surgeon: Loreta Ave, MD;  Location: [facility];  Service: Orthopedics;  Laterality: Right;   KNEE ARTHROSCOPY  2006   lt   KNEE ARTHROSCOPY  03/08/2011   Procedure: ARTHROSCOPY KNEE;  Surgeon: Loreta Ave, MD;  Location: [facility];  Service: Orthopedics;  Laterality: Left;  left knee arthroscopy  anterior horn  medial meniscus cyst   SHOULDER ARTHROSCOPY  2009   lt   SHOULDER ARTHROSCOPY  2006   right   TONSILLECTOMY      Vitals:   12/07/20 1651  BP: 140/79  Pulse: 77      Subjective  Assessment - 12/07/20 1639     Subjective Pt reports that pseudogout started in April. He originally saw [facility] where he had fluid drawn out of both his knees. He then went to orthopedist at [facility] where an analysis of fluid in his knees showed crystals and he was first diagnosed with pseudogout.    How long can you sit comfortably? N/a    How long can you stand comfortably? [employer] have been helping him so he is not limited    How long can you walk comfortably? He needs to walk slower because of pain in his knees    Diagnostic tests X-rays    Patient Stated Goals Return to regular exercise and generalized strengthening because of weakness from not moving    Currently in Pain? No/denies             KNEE EVAL   OBJECTIVE:  *INDICATES PAIN   Observation: Posture: NT  Gait: NT  Stairs: NT  Palpation: Tenderness over lateral knees     Strength:           R         L  Knee Flexion:      5/5       5/5 Knee Extension:    5/5  5/5  Hip Flexion:       4/5       4/5 Hip Extension:     4/5       4/5 Hip ER:            5/5       5/5 Hip IR:            5/5       5/5 Hip Abduction:     4/5       4/5 Hip Adduction:     4/5       4/5  Ankle DF:          5/5       5/5 Ankle PF:          5/5       5/5   Knee ROM         R      L              NORMS        AROM   PROM   AROM PROM Flex:   120    120    120 120          150 Ext:    0  Special Tests:  Meniscal  Cluster (3/5): Hx of joint locking                 (-) Pain/click with McMurray's test     (-) Joint line tenderness               (-) Pain with flexion overpressure      (+) Pain with extension overpressure    (-) Ege's Test                         NT  Thessaly Test                     NT  ACL: Lachman's                           (-) Anterior drawer                     (-)  PCL: Posterior Drawer                    (-) Posterior Sag Sign                  NT  MCL: Valgus  Stress Test                  (-) LCL:  Varus Stress Test                   (-) PFPS:  Patellar Tilt Test                  (-) Patellar Apprehension Test      NT  Flexibility tests: Thomas Test                         (+ B) Ober's Test                         (-)  Hamstring 90/90                 (+ B 70 degrees with muscle tension) Ely's Test                          (+ B* ) Gastroc/soleus     (+/-) Knee OA   Criteria for classification: Age > 50 (+) Knee crepitus (-) Palpable bony enlargement (-) Bony tenderness to palpation (-) Morning stiffness <30 min Not asked  No palpable warmth of synovium (-) < 3 helps rule OUT > 3 less impressive for ruling IN  Functional Assessment: Squat- Forward lean of trunk   THEREX:   Modified Thomas Stretch 2 x 60 sec  -min VC to move further to edge of table   Prone Quad Stretch 2 x 60 sec   Seated Hamstring Stretch 2 x 60 sec       PT Education - 12/07/20 1649     Education Details form/technique with exercise and explanaition about underlying MSK condition    Person(s) Educated Patient    Methods Explanation;Demonstration;Verbal cues;Handout;Tactile cues    Comprehension Verbalized understanding;Verbal cues required;Returned demonstration;Tactile cues required              PT Short Term Goals - 12/08/20 1009       PT SHORT TERM GOAL #1   Title Patient will demonstrate understanding of HEP to self-manage MSK condition.    Baseline 12/07/20: NT    Time 2    Period Weeks    Status New               PT Long Term Goals - 12/08/20 1012       PT LONG TERM GOAL #1   Title Patient will have improved function and activity level as evidenced by an increase in FOTO score by 10 points or more.    Baseline 12/07/20: 47/62    Time 5    Period Weeks    Status New    Target Date 01/12/21      PT LONG TERM GOAL #2   Title Patient will improve bilateral knee flexion to >=130 to demonstrate normalized knee mobility to  normalize gait mechanics.    Baseline 8/31: Bilateral Knee Flex ROM 120 deg    Time 5    Period Weeks    Status New    Target Date 01/11/21      PT LONG TERM GOAL #3   Title Patient will improve bilateral knee extension to <=0 to demonstrate normalized knee mobility to normalize gait mechanics.    Baseline 8/31: Knee Ext ROM 5 DEG    Time 5    Period Weeks    Status New    Target Date 01/11/21      PT LONG TERM GOAL #4   Title Patient will exhibit improvement  in hip strength to better stabilize knee and reduce knee pain while walking and standing.    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 He demonstrates generalized hip weakness and hip inflexibility and decreased knee  and hip ROM. All ligamentous injuries ruled out during examination. Pt will benefit from skilled PT to progress LE ROM, strength, and flexibility in order to return to walking and standing for prolonged periods of time without experiencing pain to decrease caregiver burden.    Personal Factors and Comorbidities Comorbidity 2    Comorbidities Pseudogout, HTN, T2DM, depression, and anxiety    Examination-Activity Limitations Stand;Carry    Examination-Participation Restrictions Yard Work;Shop    Stability/Clinical Decision Making Stable/Uncomplicated    Clinical Decision Making Moderate    Rehab Potential Fair    PT Frequency 2x / week    PT Duration Other (comment)   5   PT Treatment/Interventions Joint Manipulations;Spinal Manipulations;Manual techniques;Electrical Stimulation;Moist Heat;Stair training;Gait training;Balance training;Neuromuscular re-education;Therapeutic exercise;Therapeutic activities;Cryotherapy    PT Next Visit Plan Assess functional goals: 5 x STS, 10  mWT, 6 mWT. Arthrokinematic assessment of knee. Progress hip and knee strengthening and stretching            HEP includes following exercises:  Access Code: [email or web address] URL: [email or web address] Date: 12/07/2020 Prepared by: Ellin Goodie  Exercises Modified Maisie Fus Stretch - 1 x daily - 7 x weekly - 1 sets - 5 reps - 60 hold Seated Hamstring Stretch - 1 x daily - 7 x weekly - 1 sets - 5 reps - 60 hold Prone Quadriceps Stretch with Strap - 1 x daily - 7 x weekly - 1 sets - 5 reps - 60 hold  Patient will benefit from skilled therapeutic intervention in order to improve the following deficits and impairments:  Hypomobility, Obesity, Decreased mobility, Decreased range of motion, Decreased strength, Difficulty walking, Pain  Visit Diagnosis: Chronic pain of both knees  Difficulty in walking, not elsewhere classified     Problem List Patient Active Problem List   Diagnosis Date Noted   NSTEMI (non-ST elevated myocardial infarction) ([facility]) 11/12/2019   HTN (hypertension) 10/02/2017   Personal history of kidney stones 10/02/2017   Ellin Goodie PT, DPT  12/08/2020, 12:05 PM  [facility] [address] [place], [address] Phone: [phone]   Fax:  [phone]  Name: Lance [name] MRN: [id number] Date of Birth: 09-08-1955

## 2020-12-08 ENCOUNTER — Ambulatory Visit: Payer: Medicare Other | Admitting: Physical Therapy

## 2020-12-08 DIAGNOSIS — E785 Hyperlipidemia, unspecified: Principal | ICD-10-CM

## 2020-12-08 MED ORDER — ROSUVASTATIN 20 MG TABLET
ORAL_TABLET | Freq: Every day | ORAL | 6 refills | 30 days | Status: CP
Start: 2020-12-08 — End: 2021-07-06

## 2020-12-13 ENCOUNTER — Ambulatory Visit: Payer: Medicare Other | Admitting: Physical Therapy

## 2020-12-15 ENCOUNTER — Encounter: Payer: Medicare Other | Admitting: Physical Therapy

## 2020-12-21 ENCOUNTER — Encounter: Payer: Medicare Other | Admitting: Physical Therapy

## 2020-12-29 ENCOUNTER — Encounter: Payer: Medicare Other | Admitting: Physical Therapy

## 2021-01-03 ENCOUNTER — Encounter: Payer: Medicare Other | Admitting: Physical Therapy

## 2021-01-05 ENCOUNTER — Encounter: Payer: Medicare Other | Admitting: Physical Therapy

## 2021-01-10 ENCOUNTER — Encounter: Payer: Medicare Other | Admitting: Physical Therapy

## 2021-01-12 ENCOUNTER — Encounter: Payer: Medicare Other | Admitting: Physical Therapy

## 2021-01-17 ENCOUNTER — Encounter: Payer: Medicare Other | Admitting: Physical Therapy

## 2021-01-19 ENCOUNTER — Encounter: Payer: Medicare Other | Admitting: Physical Therapy

## 2021-01-24 ENCOUNTER — Encounter: Payer: Medicare Other | Admitting: Physical Therapy

## 2021-01-26 ENCOUNTER — Encounter: Payer: Medicare Other | Admitting: Physical Therapy

## 2021-01-31 ENCOUNTER — Encounter: Payer: Medicare Other | Admitting: Physical Therapy

## 2021-02-02 ENCOUNTER — Encounter: Payer: Medicare Other | Admitting: Physical Therapy

## 2021-02-22 ENCOUNTER — Ambulatory Visit: Admit: 2021-02-22 | Discharge: 2021-02-23 | Payer: MEDICARE

## 2021-02-22 DIAGNOSIS — M11269 Other chondrocalcinosis, unspecified knee: Principal | ICD-10-CM

## 2021-02-22 DIAGNOSIS — G8929 Other chronic pain: Principal | ICD-10-CM

## 2021-02-22 DIAGNOSIS — M25561 Pain in right knee: Principal | ICD-10-CM

## 2021-02-22 DIAGNOSIS — M2550 Pain in unspecified joint: Principal | ICD-10-CM

## 2021-02-22 MED ORDER — COLCHICINE 0.6 MG TABLET
ORAL_TABLET | Freq: Every day | ORAL | 2 refills | 30 days | Status: CP
Start: 2021-02-22 — End: 2022-02-22

## 2021-02-22 MED ORDER — PREDNISONE 5 MG TABLET
ORAL_TABLET | ORAL | 0 refills | 81.00000 days | Status: CP
Start: 2021-02-22 — End: 2021-05-14

## 2021-05-02 MED ORDER — PEG 3350-ELECTROLYTES 236 GRAM-22.74 GRAM-6.74 GRAM-5.86 GRAM SOLUTION
Freq: Once | ORAL | 0 refills | 1.00000 days | Status: CP
Start: 2021-05-02 — End: 2021-05-02

## 2021-05-15 ENCOUNTER — Ambulatory Visit: Admit: 2021-05-15 | Discharge: 2021-05-15 | Payer: MEDICARE

## 2021-05-15 DIAGNOSIS — E785 Hyperlipidemia, unspecified: Principal | ICD-10-CM

## 2021-05-22 MED ORDER — METOPROLOL SUCCINATE ER 50 MG TABLET,EXTENDED RELEASE 24 HR
ORAL_TABLET | Freq: Every day | ORAL | 5 refills | 30.00000 days | Status: CP
Start: 2021-05-22 — End: 2021-11-18

## 2021-05-25 ENCOUNTER — Ambulatory Visit: Admit: 2021-05-25 | Discharge: 2021-05-26 | Payer: MEDICARE | Attending: Family | Primary: Family

## 2021-05-25 DIAGNOSIS — M79641 Pain in right hand: Principal | ICD-10-CM

## 2021-05-25 DIAGNOSIS — Z79899 Other long term (current) drug therapy: Principal | ICD-10-CM

## 2021-05-25 DIAGNOSIS — M79642 Pain in left hand: Principal | ICD-10-CM

## 2021-05-31 ENCOUNTER — Ambulatory Visit: Admit: 2021-05-31 | Discharge: 2021-05-31 | Payer: MEDICARE

## 2021-05-31 ENCOUNTER — Encounter: Admit: 2021-05-31 | Discharge: 2021-05-31 | Payer: MEDICARE | Attending: Anesthesiology | Primary: Anesthesiology

## 2021-07-24 ENCOUNTER — Institutional Professional Consult (permissible substitution): Admit: 2021-07-24 | Discharge: 2021-07-25 | Payer: MEDICARE | Attending: Rheumatology | Primary: Rheumatology

## 2021-07-24 DIAGNOSIS — M79642 Pain in left hand: Principal | ICD-10-CM

## 2021-07-24 DIAGNOSIS — M79641 Pain in right hand: Principal | ICD-10-CM

## 2021-08-22 ENCOUNTER — Ambulatory Visit: Admit: 2021-08-22 | Discharge: 2021-08-23 | Payer: MEDICARE

## 2021-08-22 DIAGNOSIS — Z79899 Other long term (current) drug therapy: Principal | ICD-10-CM

## 2021-08-22 DIAGNOSIS — M11269 Other chondrocalcinosis, unspecified knee: Principal | ICD-10-CM

## 2021-08-22 MED ORDER — HYDROXYCHLOROQUINE 200 MG TABLET
ORAL_TABLET | Freq: Two times a day (BID) | ORAL | 5 refills | 30 days | Status: CP
Start: 2021-08-22 — End: 2022-08-22

## 2021-09-01 ENCOUNTER — Ambulatory Visit: Admit: 2021-09-01 | Discharge: 2021-09-02 | Payer: MEDICARE | Attending: Family | Primary: Family

## 2021-09-01 DIAGNOSIS — R200 Anesthesia of skin: Principal | ICD-10-CM

## 2021-09-01 DIAGNOSIS — R52 Pain, unspecified: Principal | ICD-10-CM

## 2021-09-01 DIAGNOSIS — R202 Paresthesia of skin: Principal | ICD-10-CM

## 2021-09-01 DIAGNOSIS — E559 Vitamin D deficiency, unspecified: Principal | ICD-10-CM

## 2021-09-25 ENCOUNTER — Ambulatory Visit
Admit: 2021-09-25 | Discharge: 2021-09-26 | Payer: MEDICARE | Attending: Student in an Organized Health Care Education/Training Program | Primary: Student in an Organized Health Care Education/Training Program

## 2021-11-05 IMAGING — US US EXTREM LOW VENOUS*R*
2 series · 13 of 24 positions shown · non-contrast
Comparison: None.

CLINICAL DATA: Right calf pain



[Series 1: us extrem low venous*right* · 0.10mm/px · 9 of 33 slices shown (1 of 2)]
[im 1/33]
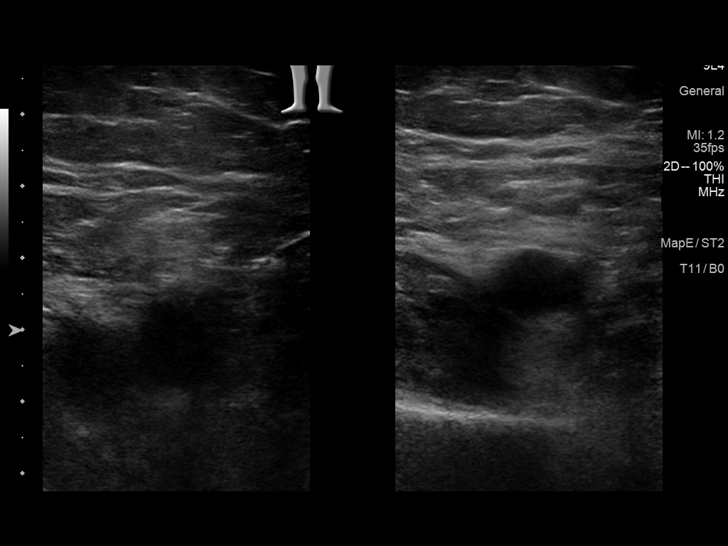
[im 5/33]
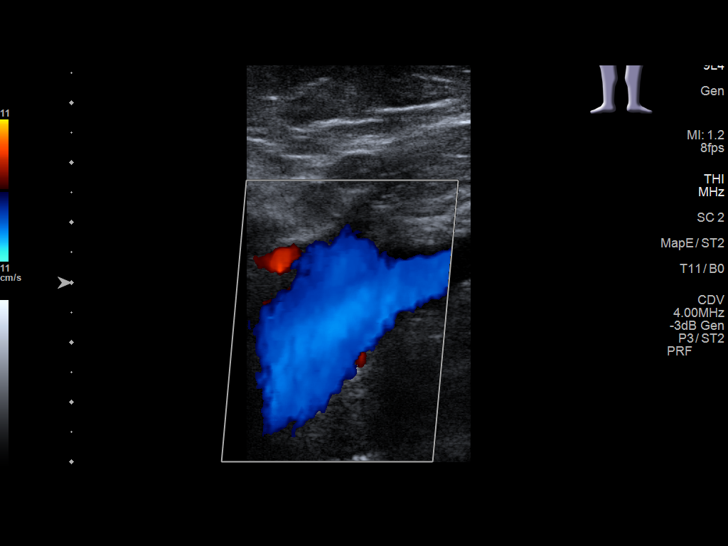
[im 9/33]
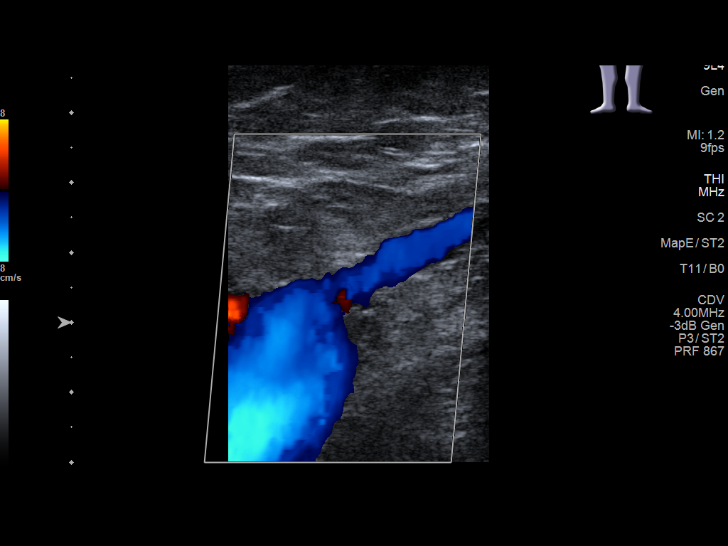
[im 13/33]
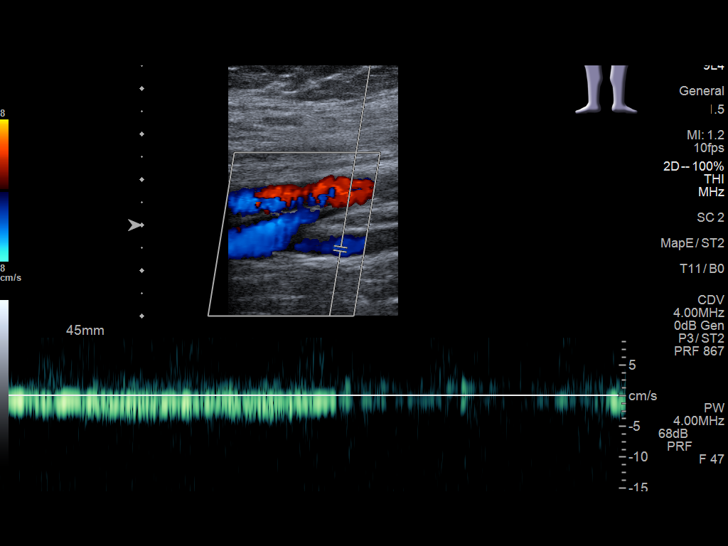
[im 18/33]
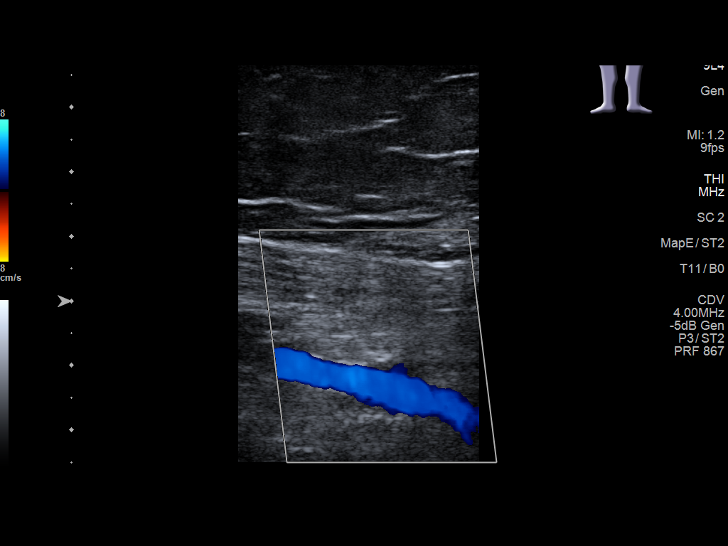
[im 22/33]
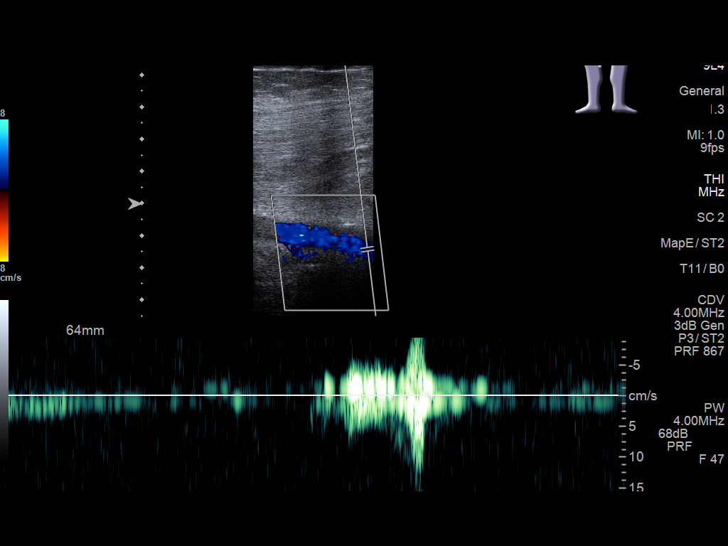
[im 26/33]
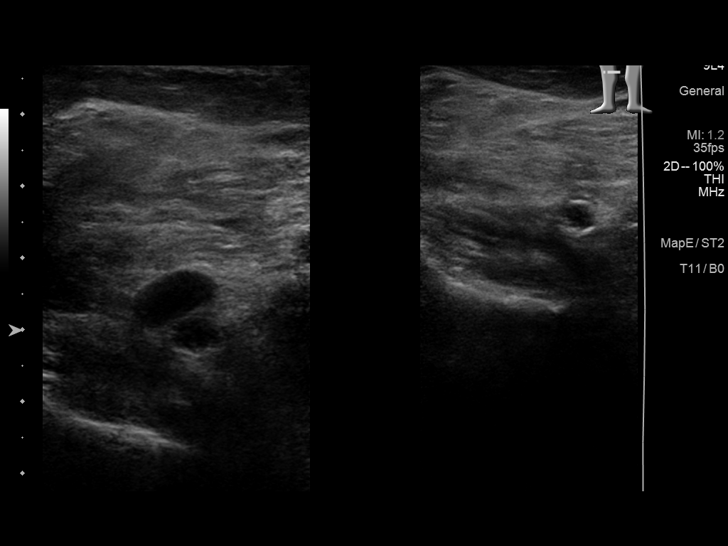
[im 28/33]
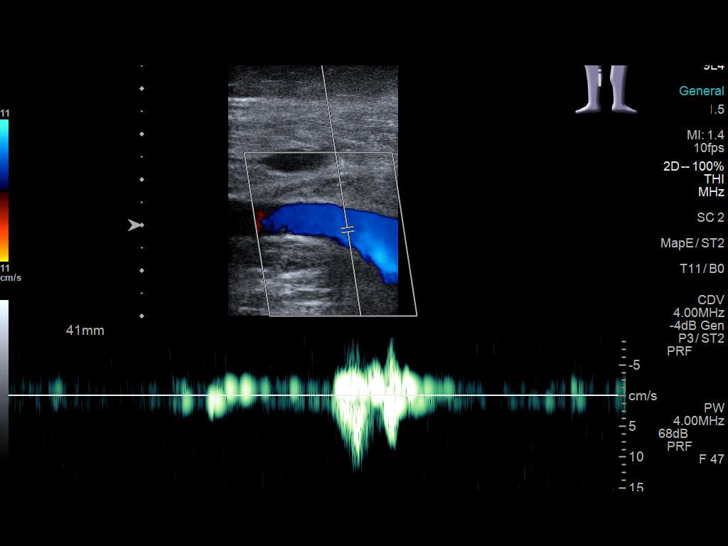
[im 33/33]
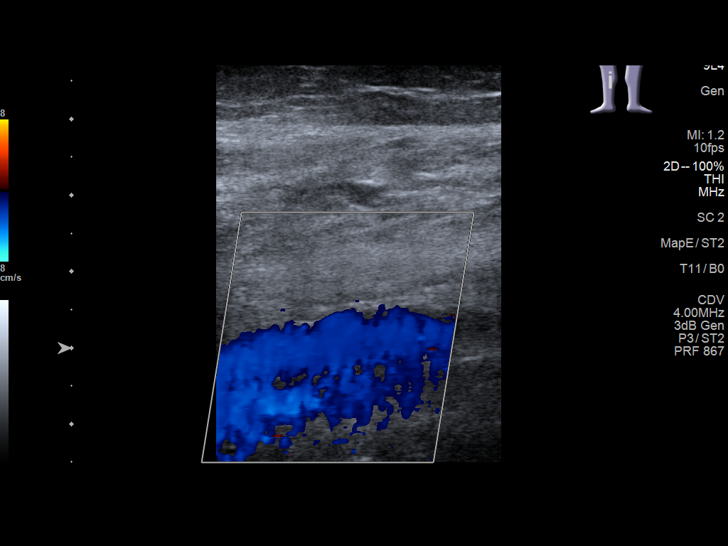

[Series 2: us extrem low venous*right* · 0.08mm/px · 4 of 18 slices shown (2 of 2)]
[im 3/18]
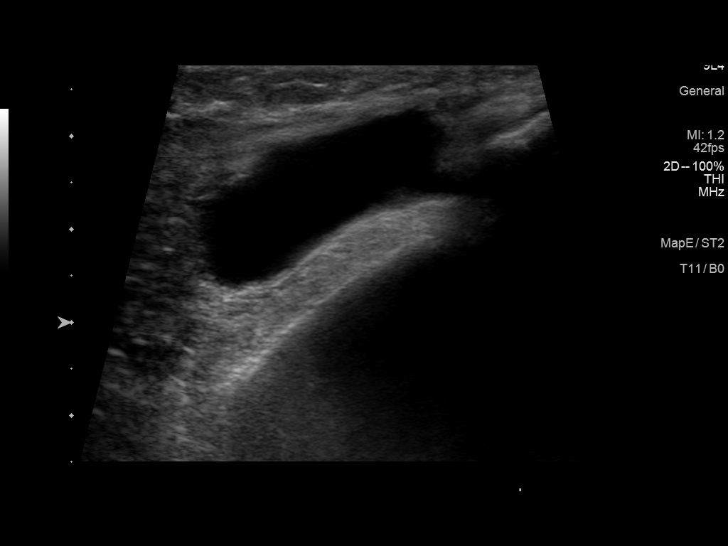
[im 8/18]
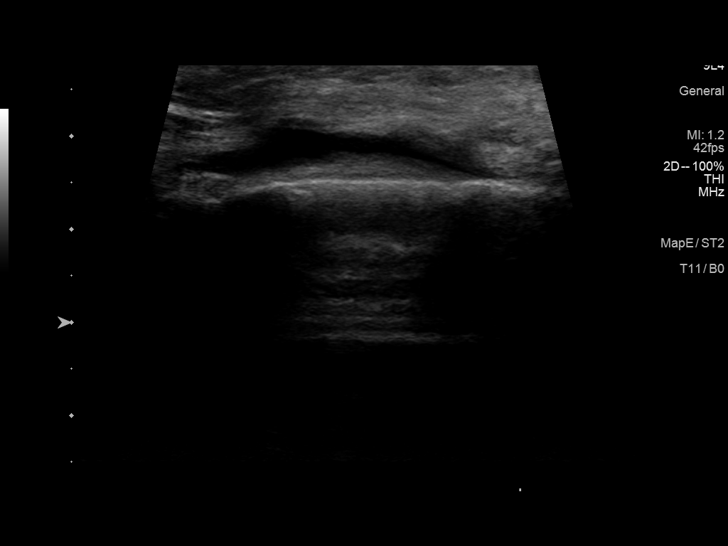
[im 13/18]
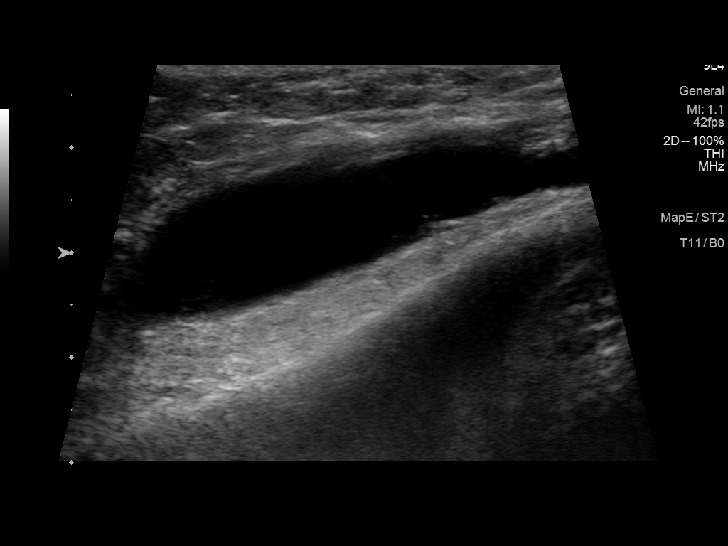
[im 18/18]
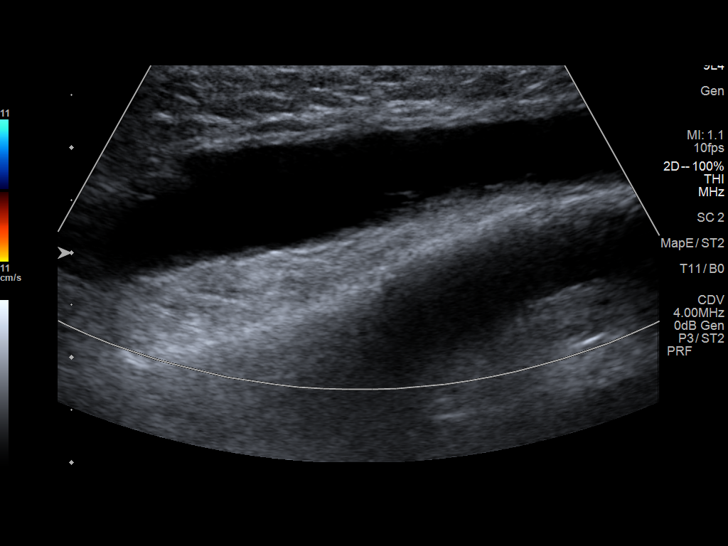

[13 of 24 positions shown; findings below may reference images not displayed]

FINDINGS: Contralateral Common Femoral Vein: Respiratory phasicity is normal
and symmetric with the symptomatic side. No evidence of thrombus.
Normal compressibility.

Common Femoral Vein: No evidence of thrombus. Normal
compressibility, respiratory phasicity and response to augmentation.

Saphenofemoral Junction: No evidence of thrombus. Normal
compressibility and flow on color Doppler imaging.

Profunda Femoral Vein: No evidence of thrombus. Normal
compressibility and flow on color Doppler imaging.

Femoral Vein: No evidence of thrombus. Normal compressibility,
respiratory phasicity and response to augmentation.

Popliteal Vein: No evidence of thrombus. Normal compressibility,
respiratory phasicity and response to augmentation.

Calf Veins: No evidence of thrombus. Normal compressibility and flow
on color Doppler imaging.

Superficial Great Saphenous Vein: No evidence of thrombus. Normal
compressibility.

Venous Reflux:  None.

Other Findings: There is an anechoic fluid collection laterally at
the level of the knee. This collection measures 5 x 6 x 4.2 cm.
IMPRESSION: 1. No for a deep vein thrombosis.
2. Anechoic fluid collection as detailed above laterally at the
level of the knee. Differential considerations include a hematoma or
seroma. Other differential considerations include extension of a
large joint effusion. A Baker's cyst seems less likely given the
location. Correlation with physical exam is recommended. A dedicated
knee radiograph may be useful for further evaluation.

## 2021-12-19 ENCOUNTER — Ambulatory Visit: Admit: 2021-12-19 | Discharge: 2021-12-20 | Payer: MEDICARE | Attending: Family | Primary: Family

## 2021-12-19 DIAGNOSIS — G8929 Other chronic pain: Principal | ICD-10-CM

## 2021-12-19 DIAGNOSIS — G629 Polyneuropathy, unspecified: Principal | ICD-10-CM

## 2021-12-19 DIAGNOSIS — M545 Chronic bilateral low back pain, unspecified whether sciatica present: Principal | ICD-10-CM

## 2021-12-19 DIAGNOSIS — G5600 Carpal tunnel syndrome, unspecified upper limb: Principal | ICD-10-CM

## 2022-01-01 ENCOUNTER — Ambulatory Visit: Admit: 2022-01-01 | Discharge: 2022-01-02 | Payer: MEDICARE

## 2022-01-01 DIAGNOSIS — M533 Sacrococcygeal disorders, not elsewhere classified: Principal | ICD-10-CM

## 2022-01-01 DIAGNOSIS — M545 Chronic bilateral low back pain, unspecified whether sciatica present: Principal | ICD-10-CM

## 2022-01-01 DIAGNOSIS — G8929 Other chronic pain: Principal | ICD-10-CM

## 2022-01-29 ENCOUNTER — Ambulatory Visit: Admit: 2022-01-29 | Discharge: 2022-01-30 | Payer: MEDICARE

## 2022-01-29 DIAGNOSIS — M533 Sacrococcygeal disorders, not elsewhere classified: Principal | ICD-10-CM

## 2022-02-13 ENCOUNTER — Ambulatory Visit: Admit: 2022-02-13 | Discharge: 2022-02-13 | Payer: MEDICARE

## 2022-02-13 ENCOUNTER — Ambulatory Visit: Admit: 2022-02-13 | Discharge: 2022-02-13 | Payer: MEDICARE | Attending: Family | Primary: Family

## 2022-02-13 DIAGNOSIS — R200 Anesthesia of skin: Principal | ICD-10-CM

## 2022-02-13 DIAGNOSIS — Z79899 Other long term (current) drug therapy: Principal | ICD-10-CM

## 2022-02-19 DIAGNOSIS — M533 Sacrococcygeal disorders, not elsewhere classified: Principal | ICD-10-CM

## 2022-03-05 ENCOUNTER — Ambulatory Visit: Admit: 2022-03-05 | Discharge: 2022-03-06 | Payer: MEDICARE

## 2022-03-05 DIAGNOSIS — M533 Sacrococcygeal disorders, not elsewhere classified: Principal | ICD-10-CM

## 2022-03-09 DIAGNOSIS — M11269 Other chondrocalcinosis, unspecified knee: Principal | ICD-10-CM

## 2022-03-09 MED ORDER — HYDROXYCHLOROQUINE 200 MG TABLET
ORAL_TABLET | Freq: Two times a day (BID) | ORAL | 3 refills | 90 days | Status: CP
Start: 2022-03-09 — End: ?

## 2022-03-21 ENCOUNTER — Ambulatory Visit
Admit: 2022-03-21 | Discharge: 2022-03-22 | Payer: MEDICARE | Attending: Student in an Organized Health Care Education/Training Program | Primary: Student in an Organized Health Care Education/Training Program

## 2022-03-21 DIAGNOSIS — Q825 Congenital non-neoplastic nevus: Principal | ICD-10-CM

## 2022-03-21 DIAGNOSIS — L570 Actinic keratosis: Principal | ICD-10-CM

## 2022-03-21 DIAGNOSIS — D229 Melanocytic nevi, unspecified: Principal | ICD-10-CM

## 2022-03-21 DIAGNOSIS — L821 Other seborrheic keratosis: Principal | ICD-10-CM

## 2022-03-21 DIAGNOSIS — L814 Other melanin hyperpigmentation: Principal | ICD-10-CM

## 2022-03-21 DIAGNOSIS — D1801 Hemangioma of skin and subcutaneous tissue: Principal | ICD-10-CM

## 2022-03-21 DIAGNOSIS — L988 Other specified disorders of the skin and subcutaneous tissue: Principal | ICD-10-CM

## 2022-05-21 ENCOUNTER — Ambulatory Visit: Admit: 2022-05-21 | Discharge: 2022-05-22 | Payer: MEDICARE

## 2022-05-21 DIAGNOSIS — I214 Non-ST elevation (NSTEMI) myocardial infarction: Principal | ICD-10-CM

## 2022-08-29 ENCOUNTER — Ambulatory Visit: Admit: 2022-08-29 | Discharge: 2022-08-29 | Payer: MEDICARE

## 2022-08-29 DIAGNOSIS — M11269 Other chondrocalcinosis, unspecified knee: Principal | ICD-10-CM

## 2022-08-29 DIAGNOSIS — Z79899 Other long term (current) drug therapy: Principal | ICD-10-CM

## 2022-08-29 MED ORDER — COLCHICINE 0.6 MG TABLET
ORAL_TABLET | Freq: Every day | ORAL | 2 refills | 30 days | Status: CP
Start: 2022-08-29 — End: 2023-08-29

## 2022-09-24 DIAGNOSIS — M11269 Other chondrocalcinosis, unspecified knee: Principal | ICD-10-CM

## 2022-09-24 MED ORDER — HYDROXYCHLOROQUINE 200 MG TABLET
ORAL_TABLET | Freq: Two times a day (BID) | ORAL | 3 refills | 90 days | Status: CP
Start: 2022-09-24 — End: ?

## 2022-10-04 ENCOUNTER — Ambulatory Visit: Admit: 2022-10-04 | Discharge: 2022-10-05 | Payer: MEDICARE

## 2022-10-04 DIAGNOSIS — M5416 Radiculopathy, lumbar region: Principal | ICD-10-CM

## 2022-10-09 ENCOUNTER — Ambulatory Visit: Admit: 2022-10-09 | Payer: MEDICARE

## 2022-11-12 ENCOUNTER — Ambulatory Visit: Admit: 2022-11-12 | Discharge: 2022-11-13 | Payer: MEDICARE

## 2022-11-12 DIAGNOSIS — E785 Hyperlipidemia, unspecified: Principal | ICD-10-CM

## 2022-11-12 MED ORDER — CARVEDILOL 6.25 MG TABLET
ORAL_TABLET | Freq: Two times a day (BID) | ORAL | 6 refills | 15 days | Status: CP
Start: 2022-11-12 — End: 2023-11-12

## 2022-11-13 ENCOUNTER — Ambulatory Visit: Admit: 2022-11-13 | Payer: MEDICARE

## 2022-11-13 ENCOUNTER — Ambulatory Visit: Admit: 2022-11-13 | Discharge: 2022-12-07 | Payer: MEDICARE

## 2022-11-13 MED ORDER — CARVEDILOL 12.5 MG TABLET
ORAL_TABLET | Freq: Two times a day (BID) | ORAL | 3 refills | 90 days | Status: CP
Start: 2022-11-13 — End: 2023-11-13

## 2022-12-25 ENCOUNTER — Ambulatory Visit: Admit: 2022-12-25 | Payer: MEDICARE

## 2022-12-27 ENCOUNTER — Ambulatory Visit: Admit: 2022-12-27 | Payer: MEDICARE

## 2022-12-27 ENCOUNTER — Ambulatory Visit: Admit: 2022-12-27 | Discharge: 2023-01-06 | Payer: MEDICARE

## 2023-01-01 ENCOUNTER — Ambulatory Visit: Admit: 2023-01-01 | Payer: MEDICARE

## 2023-01-24 ENCOUNTER — Ambulatory Visit: Admit: 2023-01-24

## 2023-03-01 ENCOUNTER — Ambulatory Visit: Admit: 2023-03-01 | Discharge: 2023-03-01 | Attending: Family | Primary: Family

## 2023-03-01 ENCOUNTER — Ambulatory Visit: Admit: 2023-03-01 | Discharge: 2023-03-01

## 2023-03-01 DIAGNOSIS — Z79899 Other long term (current) drug therapy: Principal | ICD-10-CM

## 2023-03-01 DIAGNOSIS — M11269 Other chondrocalcinosis, unspecified knee: Principal | ICD-10-CM

## 2023-03-26 ENCOUNTER — Ambulatory Visit: Admit: 2023-03-26 | Discharge: 2023-03-27 | Attending: Dermatology | Primary: Dermatology

## 2023-04-23 DIAGNOSIS — M11269 Other chondrocalcinosis, unspecified knee: Principal | ICD-10-CM

## 2023-04-23 MED ORDER — HYDROXYCHLOROQUINE 200 MG TABLET
ORAL_TABLET | Freq: Two times a day (BID) | ORAL | 3 refills | 90.00 days | Status: CP
Start: 2023-04-23 — End: ?

## 2023-05-20 ENCOUNTER — Ambulatory Visit: Admit: 2023-05-20 | Discharge: 2023-05-21 | Payer: MEDICARE

## 2023-06-11 MED ORDER — CARVEDILOL 12.5 MG TABLET
ORAL_TABLET | Freq: Two times a day (BID) | ORAL | 3 refills | 90.00 days
Start: 2023-06-11 — End: 2024-06-10

## 2023-06-12 MED ORDER — CARVEDILOL 25 MG TABLET
ORAL_TABLET | Freq: Two times a day (BID) | ORAL | 3 refills | 90.00 days | Status: CP
Start: 2023-06-12 — End: 2024-07-16

## 2023-09-04 ENCOUNTER — Ambulatory Visit: Admit: 2023-09-04 | Discharge: 2023-09-05 | Payer: Medicare (Managed Care)

## 2023-09-04 DIAGNOSIS — L821 Other seborrheic keratosis: Principal | ICD-10-CM

## 2023-09-04 DIAGNOSIS — L570 Actinic keratosis: Principal | ICD-10-CM

## 2023-09-04 DIAGNOSIS — M11269 Other chondrocalcinosis, unspecified knee: Principal | ICD-10-CM

## 2023-09-04 DIAGNOSIS — D229 Melanocytic nevi, unspecified: Principal | ICD-10-CM

## 2023-09-04 DIAGNOSIS — L814 Other melanin hyperpigmentation: Principal | ICD-10-CM

## 2023-09-04 DIAGNOSIS — Z79899 Other long term (current) drug therapy: Principal | ICD-10-CM

## 2023-09-04 MED ORDER — HYDROXYCHLOROQUINE 200 MG TABLET
ORAL_TABLET | Freq: Two times a day (BID) | ORAL | 3 refills | 90.00000 days | Status: CP
Start: 2023-09-04 — End: ?

## 2023-09-04 MED ORDER — COLCHICINE 0.6 MG TABLET
ORAL_TABLET | Freq: Every day | ORAL | 3 refills | 90.00000 days | Status: CP
Start: 2023-09-04 — End: 2024-09-03

## 2024-01-01 DIAGNOSIS — D229 Melanocytic nevi, unspecified: Principal | ICD-10-CM

## 2024-01-01 DIAGNOSIS — D1801 Hemangioma of skin and subcutaneous tissue: Principal | ICD-10-CM

## 2024-01-01 DIAGNOSIS — L570 Actinic keratosis: Principal | ICD-10-CM

## 2024-01-01 DIAGNOSIS — L820 Inflamed seborrheic keratosis: Principal | ICD-10-CM

## 2024-01-01 DIAGNOSIS — L814 Other melanin hyperpigmentation: Principal | ICD-10-CM

## 2024-05-12 ENCOUNTER — Ambulatory Visit: Admit: 2024-05-12 | Discharge: 2024-05-13 | Payer: Medicare (Managed Care) | Attending: Family | Primary: Family

## 2024-05-12 DIAGNOSIS — Z79899 Other long term (current) drug therapy: Principal | ICD-10-CM

## 2024-05-12 DIAGNOSIS — M11269 Other chondrocalcinosis, unspecified knee: Secondary | ICD-10-CM

## 2024-05-12 MED ORDER — COLCHICINE 0.6 MG TABLET
ORAL_TABLET | Freq: Every day | ORAL | 3 refills | 90.00000 days | Status: CP
Start: 2024-05-12 — End: 2025-05-12

## 2024-05-12 MED ORDER — HYDROXYCHLOROQUINE 200 MG TABLET
ORAL_TABLET | Freq: Two times a day (BID) | ORAL | 3 refills | 90.00000 days | Status: CP
Start: 2024-05-12 — End: ?
